# Patient Record
Sex: Female | Born: 1995
Health system: Southern US, Community
[De-identification: ages and names within clinical notes are randomized; demographics above are authoritative.]

## PROBLEM LIST (undated history)

## (undated) DIAGNOSIS — J302 Other seasonal allergic rhinitis: Secondary | ICD-10-CM

## (undated) HISTORY — DX: Other seasonal allergic rhinitis: J30.2

---
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
## 2015-03-25 ENCOUNTER — Telehealth: Payer: Self-pay | Admitting: Women's Health

## 2015-03-25 ENCOUNTER — Ambulatory Visit (INDEPENDENT_AMBULATORY_CARE_PROVIDER_SITE_OTHER): Payer: 59 | Admitting: Women's Health

## 2015-03-25 ENCOUNTER — Encounter: Payer: Self-pay | Admitting: Women's Health

## 2015-03-25 VITALS — BP 100/80 | Ht 61.0 in | Wt 95.0 lb

## 2015-03-25 DIAGNOSIS — B3731 Acute candidiasis of vulva and vagina: Secondary | ICD-10-CM

## 2015-03-25 DIAGNOSIS — B373 Candidiasis of vulva and vagina: Secondary | ICD-10-CM | POA: Diagnosis not present

## 2015-03-25 DIAGNOSIS — Z01419 Encounter for gynecological examination (general) (routine) without abnormal findings: Secondary | ICD-10-CM

## 2015-03-25 DIAGNOSIS — Z113 Encounter for screening for infections with a predominantly sexual mode of transmission: Secondary | ICD-10-CM | POA: Diagnosis not present

## 2015-03-25 DIAGNOSIS — F4323 Adjustment disorder with mixed anxiety and depressed mood: Secondary | ICD-10-CM

## 2015-03-25 LAB — CBC WITH DIFFERENTIAL/PLATELET
BASOS ABS: 0 10*3/uL (ref 0.0–0.1)
Basophils Relative: 0 % (ref 0–1)
EOS PCT: 2 % (ref 0–5)
Eosinophils Absolute: 0.2 10*3/uL (ref 0.0–0.7)
HCT: 38.6 % (ref 36.0–46.0)
HEMOGLOBIN: 13.4 g/dL (ref 12.0–15.0)
Lymphocytes Relative: 34 % (ref 12–46)
Lymphs Abs: 2.9 10*3/uL (ref 0.7–4.0)
MCH: 29.8 pg (ref 26.0–34.0)
MCHC: 34.7 g/dL (ref 30.0–36.0)
MCV: 85.8 fL (ref 78.0–100.0)
MONO ABS: 0.6 10*3/uL (ref 0.1–1.0)
MPV: 9.4 fL (ref 8.6–12.4)
Monocytes Relative: 7 % (ref 3–12)
Neutro Abs: 4.8 10*3/uL (ref 1.7–7.7)
Neutrophils Relative %: 57 % (ref 43–77)
Platelets: 285 10*3/uL (ref 150–400)
RBC: 4.5 MIL/uL (ref 3.87–5.11)
RDW: 13.3 % (ref 11.5–15.5)
WBC: 8.5 10*3/uL (ref 4.0–10.5)

## 2015-03-25 LAB — WET PREP FOR TRICH, YEAST, CLUE
Clue Cells Wet Prep HPF POC: NONE SEEN
Trich, Wet Prep: NONE SEEN

## 2015-03-25 LAB — TSH: TSH: 0.686 u[IU]/mL (ref 0.350–4.500)

## 2015-03-25 MED ORDER — FLUCONAZOLE 150 MG PO TABS: 150.0000 mg | ORAL_TABLET | Freq: Once | ORAL | Status: DC

## 2015-03-25 NOTE — Telephone Encounter (Signed)
03/25/15-Pt was informed today that her [facility] [employer] ins covers the removal and insertion of Nexplanon for contraception at 100%. She will find out When last one was inserted and call back to schedule replacement.wl

## 2015-03-25 NOTE — Progress Notes (Signed)
Mallory Myers 1996-09-25 [id number]    History:    Presents for annual exam.  Irregular bleeding, every 2-3 months spotting on Nexplanon [facility]. History of long-term depression on no medication. Gardasil series completed.  Past medical history, past surgical history, family history and social history were all reviewed and documented in the EPIC chart. 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 Same partner for 1-1/2 years. Mother anxiety/hypertension/asthma.  ROS:  A ROS was performed and pertinent positives and negatives are included.  Exam:  Filed Vitals:   03/25/15 1454  BP: 100/80    General appearance:  Normal Thyroid:  Symmetrical, normal in size, without palpable masses or nodularity. Respiratory  Auscultation:  Clear without wheezing or rhonchi Cardiovascular  Auscultation:  Regular rate, without rubs, murmurs or gallops  Edema/varicosities:  Not grossly evident Abdominal  Soft,nontender, without masses, guarding or rebound.  Liver/spleen:  No organomegaly noted  Hernia:  None appreciated  Skin  Inspection:  Grossly normal   Breasts: Examined lying and sitting.     Right: Without masses, retractions, discharge or axillary adenopathy.     Left: Without masses, retractions, discharge or axillary adenopathy. Gentitourinary   Inguinal/mons:  Normal without inguinal adenopathy  External genitalia:  Normal  BUS/Urethra/Skene's glands:  Normal  Vagina:  Moderate white discharge, wet prep positive for yeast  Cervix:  Normal  Uterus:   normal in size, shape and contour.  Midline and mobile  Adnexa/parametria:     Rt: Without masses or tenderness.   Lt: Without masses or tenderness.  Anus and perineum: Normal   Assessment/Plan:  19 y.o. SWF G0 for annual exam.   Implanon left arm 2014 STD screen Yeast vaginitis Anxiety/depression-therapy in the past on no medication.  Plan:1. [facility] find out date of placement of Implanon, reviewed it is good for 3 years, if due to be changed instructed to call back. 2. GC/Chlamydia, HIV, hep B, C, RPR.  encouraged condoms until permanent partner. 3. Diflucan 150 by mouth 1 dose, yeast prevention discussed. 4. Encourage counseling as needed, long-term history of anxiety and depression. Health maintenance-SBE's, regular exercise, calcium rich diet, campus safety reviewed. MVI daily encouraged. CBC, TSH, UA.    Harrington ChallengerYOUNG,NANCY J WHNP, 3:50 PM 03/25/2015

## 2015-03-25 NOTE — Patient Instructions (Addendum)
Health Maintenance - 17-19 Years Old SCHOOL PERFORMANCE After high school, you may attend college or technical or vocational school, enroll in the [employer], or enter the workforce. PHYSICAL, SOCIAL, AND EMOTIONAL DEVELOPMENT  One hour of regular physical activity daily is recommended. Continue to participate in sports.  Develop your own interests and consider community service or volunteerism.  Make decisions about college and work plans.  Throughout these years, you should assume responsibility for your own health care. Increasing independence is important for you.  You may be exploring your sexual identity. Understand that you should never be in a situation that makes you feel uncomfortable, and tell your partner if you do not want to engage in sexual activity.  Body image may become important to you. Be mindful that eating disorders can develop at this time. Talk to your parents or other caregivers if you have concerns about body image, weight gain, or losing weight.  You may notice mood disturbances, depression, anxiety, attention problems, or trouble with alcohol. Talk to your health care provider if you have concerns about mental illness.  Set limits for yourself and talk with your parents or other caregivers about independent decision making.  Handle conflict without physical violence.  Avoid loud noises which may impair hearing.  Limit television and computer time to 2 hours each day. Individuals who engage in excessive inactivity are more likely to become overweight. RECOMMENDED IMMUNIZATIONS  Influenza vaccine.  All adults should be immunized every year.  All adults, including pregnant women and people with hives-only allergy to eggs, can receive the inactivated influenza (IIV) vaccine.  Adults aged 18-49 years can receive the recombinant influenza (RIV) vaccine. The RIV vaccine does not contain any egg protein.  Tetanus, diphtheria, and acellular pertussis (Td, Tdap)  vaccine.  Pregnant women should receive 1 dose of Tdap vaccine during each pregnancy. The dose should be obtained regardless of the length of time since the last dose. Immunization is preferred during the 27th to 36th week of gestation.  An adult who has not previously received Tdap or who does not know his or her vaccine status should receive 1 dose of Tdap. This initial dose should be followed by tetanus and diphtheria toxoids (Td) booster doses every 10 years.  Adults with an unknown or incomplete history of completing a 3-dose immunization series with Td-containing vaccines should begin or complete a primary immunization series including a Tdap dose.  Adults should receive a Td booster every 10 years.  Varicella vaccine.  An adult without evidence of immunity to varicella should receive 2 doses or a second dose if he or she has previously received 1 dose.  Pregnant females who do not have evidence of immunity should receive the first dose after pregnancy. This first dose should be obtained before leaving the health care facility. The second dose should be obtained 4-8 weeks after the first dose.  Human papillomavirus (HPV) vaccine.  Females aged 13-26 years who have not received the vaccine previously should obtain the 3-dose series.  The vaccine is not recommended for pregnant females. However, pregnancy testing is not needed before receiving a dose. If a female is found to be pregnant after receiving a dose, no treatment is needed. In that case, the remaining doses should be delayed until after the pregnancy.  Males aged 67-21 years who have not received the vaccine previously should receive the 3-dose series. Males aged 22-26 years may be immunized.  Immunization is recommended through the age of 40 years for any  female who has sex with males and did not get any or all doses earlier.  Immunization is recommended for any person with an immunocompromised condition through the age of 69  years if he or she did not get any or all doses earlier.  During the 3-dose series, the second dose should be obtained 4-8 weeks after the first dose. The third dose should be obtained 24 weeks after the first dose and 16 weeks after the second dose.  Measles, mumps, and rubella (MMR) vaccine.  Adults born in [birth year] or later should have 1 or more doses of MMR vaccine unless there is a contraindication to the vaccine or there is laboratory evidence of immunity to each of the three diseases.  A routine second dose of MMR vaccine should be obtained at least 28 days after the first dose for students attending postsecondary schools, health care workers, and international travelers.  For females of childbearing age, rubella immunity should be determined. If there is no evidence of immunity, females who are not pregnant should be vaccinated. If there is no evidence of immunity, females who are pregnant should delay immunization until after pregnancy.  Pneumococcal 13-valent conjugate (PCV13) vaccine.  When indicated, a person who is uncertain of his or her immunization history and has no record of immunization should receive the PCV13 vaccine.  An adult aged 31 years or older who has certain medical conditions and has not been previously immunized should receive 1 dose of PCV13 vaccine. This PCV13 should be followed with a dose of pneumococcal polysaccharide (PPSV23) vaccine. The PPSV23 vaccine dose should be obtained at least 8 weeks after the dose of PCV13 vaccine.  An adult aged 88 years or older who has certain medical conditions and previously received 1 or more doses of PPSV23 vaccine should receive 1 dose of PCV13. The PCV13 vaccine dose should be obtained 1 or more years after the last PPSV23 vaccine dose.  Pneumococcal polysaccharide (PPSV23) vaccine.  When PCV13 is also indicated, PCV13 should be obtained first.  An adult younger than age 19 years who has certain medical conditions should be  immunized.  Any person who resides in a long-term care facility should be immunized.  An adult smoker should be immunized.  People with an immunocompromised condition and certain other conditions should receive both PCV13 and PPSV23 vaccines.  People with human immunodeficiency virus (HIV) infection should be immunized as soon as possible after diagnosis.  Immunization during chemotherapy or radiation therapy should be avoided.  Routine use of PPSV23 vaccine is not recommended for American Indians, [place] Natives, or people younger than 65 years unless there are medical conditions that require PPSV23 vaccine.  When indicated, people who have unknown immunization and have no record of immunization should receive PPSV23 vaccine.  One-time revaccination 5 years after the first dose of PPSV23 is recommended for people aged 19-64 years who have chronic kidney failure, nephrotic syndrome, asplenia, or immunocompromised conditions.  Meningococcal vaccine.  Adults with asplenia or persistent complement component deficiencies should receive 2 doses of quadrivalent meningococcal conjugate (MenACWY-D) vaccine. The doses should be obtained at least 2 months apart.  Microbiologists working with certain meningococcal bacteria, [place] recruits, people at risk during an outbreak, and people who travel to or live in countries with a high rate of meningitis should be immunized.  A first-year college student up through age 60 years who is living in a residence hall should receive a dose if he or she did not receive a dose on  or after his or her 16th birthday.  Adults who have certain high-risk conditions should receive one or more doses of vaccine.  Hepatitis A vaccine.  Adults who wish to be protected from this disease, have certain high-risk conditions, work with hepatitis A-infected animals, work in hepatitis A research labs, or travel to or work in countries with a high rate of hepatitis A should be  immunized.  Adults who were previously unvaccinated and who anticipate close contact with an international adoptee during the first 60 days after arrival in the [place] from a country with a high rate of hepatitis A should be immunized.  Hepatitis B vaccine.  Adults who wish to be protected from this disease, have certain high-risk conditions, may be exposed to blood or other infectious body fluids, are household contacts or sex partners of hepatitis B positive people, are clients or workers in certain care facilities, or travel to or work in countries with a high rate of hepatitis B should be immunized.  Haemophilus influenzae type b (Hib) vaccine.  A previously unvaccinated person with asplenia or sickle cell disease or having a scheduled splenectomy should receive 1 dose of Hib vaccine.  Regardless of previous immunization, a recipient of a hematopoietic stem cell transplant should receive a 3-dose series 6-12 months after his or her successful transplant.  Hib vaccine is not recommended for adults with HIV infection. TESTING  Annual screening for vision and hearing problems is recommended. Vision should be screened at least once between 18-21 years of age.  You may be screened for anemia or tuberculosis.  You should have a blood test to check for high cholesterol.  You should be screened for alcohol and drug use.  If you are sexually active, you may be screened for sexually transmitted infections (STIs), pregnancy, or HIV. You should be screened for STIs if:  Your sexual activity has changed since the last screening test, and you are at an increased risk for chlamydia or gonorrhea. Ask your health care provider if you are at risk.  If you are at an increased risk for hepatitis B, you should be screened for this virus. You are considered at high risk for hepatitis B if you:  Were born in a country where hepatitis B occurs often. Talk with your health care provider about which  countries are considered high risk.  Have parents who were born in a high-risk country and have not received a shot to protect against hepatitis B (hepatitis B vaccine).  Have HIV or AIDS.  Use needles to inject street drugs.  Live with or have sex with someone who has hepatitis B.  Are a man who has sex with other men (MSM).  Get hemodialysis treatment.  Take certain medicines for conditions like cancer, organ transplantation, or autoimmune conditions. NUTRITION   You should:  Have three servings of low-fat milk and dairy products daily. If you do not drink milk or consume dairy products, you should eat calcium-enriched foods, such as juice, bread, or cereal. Dark, leafy greens or canned fish are alternate sources of calcium.  Drink plenty of water. Fruit juice should be limited to 8-12 oz (240-360 mL) each day. Sugary beverages and sodas should be avoided.  Avoid eating foods high in fat, salt, or sugar, such as chips, candy, and cookies.  Avoid fast foods and limit eating out at restaurants.  Try not to skip meals, especially breakfast. You should eat a variety of vegetables, fruits, and lean meats.  Eat meals   together as a family whenever possible. ORAL HEALTH Brush your teeth twice a day and floss at least once a day. You should have two dental exams a year.  SKIN CARE You should wear sunscreen when out in the sun. TALK TO SOMEONE ABOUT:  Precautions against pregnancy, contraception, and sexually transmitted infections.  Taking a prescription medicine daily to prevent HIV infection if you are at risk of being infected with HIV. This is called preexposure prophylaxis (PrEP). You are at risk if you:  Are a female who has sex with other males (MSM).  Are heterosexual and sexually active with more than one partner.  Take drugs by injection.  Are sexually active with a partner who has HIV.  Whether you are at high risk of being infected with HIV. If you choose to begin  PrEP, you should first be tested for HIV. You should then be tested every 3 months for as long as you are taking PrEP.  Drug, tobacco, and alcohol use among your friends or at friends' homes. Smoking tobacco or marijuana and taking drugs have health consequences and may impact your brain development.  Appropriate use of over-the-counter or prescription medicines.  Driving guidelines and riding with friends.  The risks of drinking and driving or boating. Call someone if you have been drinking or using drugs and need a ride. WHAT'S NEXT? Visit your pediatrician or family physician once a year. By young adulthood, you should transition from your pediatrician to a family physician or internal medicine specialist. If you are a female and are sexually active, you may want to begin annual physical exams with a gynecologist. Document Released: 02/09/2007 Document Revised: 11/19/2013 Document Reviewed: 03/01/2007 [facility] Patient Information 2015 [place], [place]. This information is not intended to replace advice given to you by your health care provider. Make sure you discuss any questions you have with your health care provider. Monilial Vaginitis Vaginitis in a soreness, swelling and redness (inflammation) of the vagina and vulva. Monilial vaginitis is not a sexually transmitted infection. CAUSES  Yeast vaginitis is caused by yeast (candida) that is normally found in your vagina. With a yeast infection, the candida has overgrown in number to a point that upsets the chemical balance. SYMPTOMS   White, thick vaginal discharge.  Swelling, itching, redness and irritation of the vagina and possibly the lips of the vagina (vulva).  Burning or painful urination.  Painful intercourse. DIAGNOSIS  Things that may contribute to monilial vaginitis are:  Postmenopausal and virginal states.  Pregnancy.  Infections.  Being tired, sick or stressed, especially if you had monilial vaginitis in the  past.  Diabetes. Good control will help lower the chance.  Birth control pills.  Tight fitting garments.  Using bubble bath, feminine sprays, douches or deodorant tampons.  Taking certain medications that kill germs (antibiotics).  Sporadic recurrence can occur if you become ill. TREATMENT  Your caregiver will give you medication.  There are several kinds of anti monilial vaginal creams and suppositories specific for monilial vaginitis. For recurrent yeast infections, use a suppository or cream in the vagina 2 times a week, or as directed.  Anti-monilial or steroid cream for the itching or irritation of the vulva may also be used. Get your caregiver's permission.  Painting the vagina with methylene blue solution may help if the monilial cream does not work.  Eating yogurt may help prevent monilial vaginitis. HOME CARE INSTRUCTIONS   Finish all medication as prescribed.  Do not have sex until treatment is completed  or after your caregiver tells you it is okay.  Take warm sitz baths.  Do not douche.  Do not use tampons, especially scented ones.  Wear cotton underwear.  Avoid tight pants and panty hose.  Tell your sexual partner that you have a yeast infection. They should go to their caregiver if they have symptoms such as mild rash or itching.  Your sexual partner should be treated as well if your infection is difficult to eliminate.  Practice safer sex. Use condoms.  Some vaginal medications cause latex condoms to fail. Vaginal medications that harm condoms are:  Cleocin cream.  Butoconazole (Femstat).  Terconazole (Terazol) vaginal suppository.  Miconazole (Monistat) (may be purchased over the counter). SEEK MEDICAL CARE IF:   You have a temperature by mouth above 102 F (38.9 C).  The infection is getting worse after 2 days of treatment.  The infection is not getting better after 3 days of treatment.  You develop blisters in or around your  vagina.  You develop vaginal bleeding, and it is not your menstrual period.  You have pain when you urinate.  You develop intestinal problems.  You have pain with sexual intercourse. Document Released: 08/24/2005 Document Revised: 02/06/2012 Document Reviewed: 05/08/2009 [facility] Patient Information 2015 [place], [place]. This information is not intended to replace advice given to you by your health care provider. Make sure you discuss any questions you have with your health care provider.

## 2015-03-26 LAB — HIV ANTIBODY (ROUTINE TESTING W REFLEX): HIV: NONREACTIVE

## 2015-03-26 LAB — URINALYSIS W MICROSCOPIC + REFLEX CULTURE
BILIRUBIN URINE: NEGATIVE
Bacteria, UA: NONE SEEN
Casts: NONE SEEN
Crystals: NONE SEEN
GLUCOSE, UA: NEGATIVE mg/dL
HGB URINE DIPSTICK: NEGATIVE
Ketones, ur: NEGATIVE mg/dL
LEUKOCYTES UA: NEGATIVE
Nitrite: NEGATIVE
Protein, ur: NEGATIVE mg/dL
Specific Gravity, Urine: 1.03 (ref 1.005–1.030)
Urobilinogen, UA: 0.2 mg/dL (ref 0.0–1.0)
pH: 5.5 (ref 5.0–8.0)

## 2015-03-26 LAB — HEPATITIS B SURFACE ANTIGEN: Hepatitis B Surface Ag: NEGATIVE

## 2015-03-26 LAB — GC/CHLAMYDIA PROBE AMP
CT Probe RNA: NEGATIVE
GC PROBE AMP APTIMA: NEGATIVE

## 2015-03-26 LAB — RPR

## 2015-03-26 LAB — HEPATITIS C ANTIBODY: HCV AB: NEGATIVE

## 2015-03-27 ENCOUNTER — Telehealth: Payer: Self-pay | Admitting: Gynecology

## 2015-03-27 NOTE — Telephone Encounter (Signed)
03/27/15-Talked with pt today to verify when her Nexplanon was placed. It was done January 2014  So replacement not until December this year or January 2017. Did explain need to verify benefits again if done in January. Pt to advise her father that she was given the benefits for the Nexplanon as he had called here earlier in regards to this but her DPR said to deal with her only.wl

## 2015-09-30 ENCOUNTER — Ambulatory Visit (INDEPENDENT_AMBULATORY_CARE_PROVIDER_SITE_OTHER): Payer: 59 | Admitting: Family Medicine

## 2015-09-30 ENCOUNTER — Encounter: Payer: Self-pay | Admitting: Family Medicine

## 2015-09-30 VITALS — BP 115/74 | HR 83 | Temp 98.3°F | Ht 62.5 in | Wt 96.5 lb

## 2015-09-30 DIAGNOSIS — R062 Wheezing: Secondary | ICD-10-CM

## 2015-09-30 DIAGNOSIS — N898 Other specified noninflammatory disorders of vagina: Secondary | ICD-10-CM

## 2015-09-30 DIAGNOSIS — L298 Other pruritus: Secondary | ICD-10-CM | POA: Diagnosis not present

## 2015-09-30 MED ORDER — AZITHROMYCIN 250 MG PO TABS: ORAL_TABLET | ORAL | Status: AC

## 2015-09-30 MED ORDER — FLUCONAZOLE 150 MG PO TABS: ORAL_TABLET | ORAL | Status: AC

## 2015-09-30 MED ORDER — PREDNISONE 20 MG PO TABS: ORAL_TABLET | ORAL | Status: AC

## 2015-09-30 NOTE — Progress Notes (Signed)
CC: Mallory Myers is a 19 y.o. female is here for Establish Care; Cough; Vaginal Itching; and Headache   Subjective: HPI:  Very pleasant 19 year old here to establish care.  She complains of 3-4 days of productive cough and wheezing. She has some shortness of breath when trying to fall asleep at night. Symptoms are slightly improved with taking a steamy shower. Nothing else seems to make symptoms better or worse. They're worse at night and overall moderate in severity. She believes that symptoms have been worsening. She's felt subjective fever and chills and is beginning to get a headache in the sinuses of the forehead and cheeks. She denies sore throat, nasal congestion or pain with breathing.  She's also noticed some mild vaginal itching that has slowly been getting better over the past 5 days. She denies any vaginal discharge or any genitourinary complaints other than this  Review of Systems - General ROS: negative for - night sweats, weight gain or weight loss Ophthalmic ROS: negative for - decreased vision Psychological ROS: negative for - anxiety or depression ENT ROS: negative for - hearing change, nasal congestion, tinnitus or allergies Hematological and Lymphatic ROS: negative for - bleeding problems, bruising or swollen lymph nodes Breast ROS: negative Respiratory ROS: Positive for cough, shortness of breath, and wheezing Cardiovascular ROS: no chest pain or dyspnea on exertion Gastrointestinal ROS: no abdominal pain, change in bowel habits, or black or bloody stools Genito-Urinary ROS: negative for - genital discharge, genital ulcers, incontinence or abnormal bleeding from genitals Musculoskeletal ROS: negative for - joint pain or muscle pain Neurological ROS: negative for - headaches or memory loss Dermatological ROS: negative for lumps, mole changes, rash and skin lesion changes  Past Medical History  Diagnosis Date  . Seasonal allergies     Past Surgical History   Procedure Laterality Date  . Tympanostomy tube placement  1999   Family History  Problem Relation Age of Onset  . Depression Mother   . Anxiety disorder Mother   . Hypertension Mother   . Asthma Mother   . Alcohol abuse Other     Social History   Social History  . Marital Status: Single    Spouse Name: N/A  . Number of Children: N/A  . Years of Education: N/A   Occupational History  . Not on file.   Social History Main Topics  . Smoking status: Never Smoker   . Smokeless tobacco: Never Used  . Alcohol Use: No  . Drug Use: No  . Sexual Activity: Yes    Birth Control/ Protection: Implant     Comment: INTERCOURSE AGE 19, SEXUAL PARTNERS LESS THAN 5   Other Topics Concern  . Not on file   Social History Narrative     Objective: BP 115/74 mmHg  Pulse 83  Temp(Src) 98.3 F (36.8 C) (Oral)  Ht 5' 2.5" (1.588 m)  Wt 96 lb 8 oz (43.772 kg)  BMI 17.36 kg/m2  General: Alert and Oriented, No Acute Distress HEENT: Pupils equal, round, reactive to light. Conjunctivae clear.  External ears unremarkable, canals clear with intact TMs with appropriate landmarks.  Middle ear appears open without effusion. Pink inferior turbinates.  Moist mucous membranes, pharynx without inflammation nor lesions.  Neck supple without palpable lymphadenopathy nor abnormal masses. Lungs: Comfortable work of breathing with no rhonchi or rales but she does have mild wheezing in the left posterior upper lung fields Cardiac: Regular rate and rhythm. Normal S1/S2.  No murmurs, rubs, nor gallops.   Extremities:  No peripheral edema.  Strong peripheral pulses.  Mental Status: No depression, anxiety, nor agitation. Skin: Warm and dry.  Assessment & Plan: Wyn ForsterMadison was seen today for establish care, cough, vaginal itching and headache.  Diagnoses and all orders for this visit:  Wheezing -     azithromycin (ZITHROMAX) 250 MG tablet; Take two tabs at once on day 1, then one tab daily on days 2-5. -      predniSONE (DELTASONE) 20 MG tablet; Three tabs at once daily for five days.  Vaginal itching -     fluconazole (DIFLUCAN) 150 MG tablet; Take one tab, may take second tab if no improvement after 72 hours.  Wheezing: Start azithromycin and prednisone burst. Since she'll be on steroids and antibiotic the chances of vaginal candidiasis in the setting of already having some itching is high enough to where I would like her to have a consult on hand in case symptoms worsen with respect to itching.  Return if symptoms worsen or fail to improve.

## 2015-11-26 ENCOUNTER — Telehealth: Payer: Self-pay | Admitting: Gynecology

## 2015-11-26 NOTE — Telephone Encounter (Signed)
11/26/15-I attempted to call pt on cell as DPR directed but was told by recording line doesn't accept incoming calls. Her [employer] ins will cover the replacement of her Nexplanon for contraception at 100%, no copay. She already has appt with JF for January 2017.wl

## 2015-12-09 ENCOUNTER — Encounter: Payer: Self-pay | Admitting: Gynecology

## 2015-12-09 ENCOUNTER — Ambulatory Visit (INDEPENDENT_AMBULATORY_CARE_PROVIDER_SITE_OTHER): Payer: 59 | Admitting: Gynecology

## 2015-12-09 VITALS — BP 110/70

## 2015-12-09 DIAGNOSIS — Z975 Presence of (intrauterine) contraceptive device: Secondary | ICD-10-CM | POA: Insufficient documentation

## 2015-12-09 DIAGNOSIS — Z3046 Encounter for surveillance of implantable subdermal contraceptive: Secondary | ICD-10-CM

## 2015-12-09 DIAGNOSIS — Z30017 Encounter for initial prescription of implantable subdermal contraceptive: Secondary | ICD-10-CM

## 2015-12-09 NOTE — Patient Instructions (Signed)
Etonogestrel implant What is this medicine? ETONOGESTREL (et oh noe JES trel) is a contraceptive (birth control) device. It is used to prevent pregnancy. It can be used for up to 3 years. This medicine may be used for other purposes; ask your health care provider or pharmacist if you have questions. What should I tell my health care provider before I take this medicine? They need to know if you have any of these conditions: -abnormal vaginal bleeding -blood vessel disease or blood clots -cancer of the breast, cervix, or liver -depression -diabetes -gallbladder disease -headaches -heart disease or recent heart attack -high blood pressure -high cholesterol -kidney disease -liver disease -renal disease -seizures -tobacco smoker -an unusual or allergic reaction to etonogestrel, other hormones, anesthetics or antiseptics, medicines, foods, dyes, or preservatives -pregnant or trying to get pregnant -breast-feeding How should I use this medicine? This device is inserted just under the skin on the inner side of your upper arm by a health care professional. Talk to your pediatrician regarding the use of this medicine in children. Special care may be needed. Overdosage: If you think you have taken too much of this medicine contact a poison control center or emergency room at once. NOTE: This medicine is only for you. Do not share this medicine with others. What if I miss a dose? This does not apply. What may interact with this medicine? Do not take this medicine with any of the following medications: -amprenavir -bosentan -fosamprenavir This medicine may also interact with the following medications: -barbiturate medicines for inducing sleep or treating seizures -certain medicines for fungal infections like ketoconazole and itraconazole -griseofulvin -medicines to treat seizures like carbamazepine, felbamate, oxcarbazepine, phenytoin,  topiramate -modafinil -phenylbutazone -rifampin -some medicines to treat HIV infection like atazanavir, indinavir, lopinavir, nelfinavir, tipranavir, ritonavir -St. John's wort This list may not describe all possible interactions. Give your health care provider a list of all the medicines, herbs, non-prescription drugs, or dietary supplements you use. Also tell them if you smoke, drink alcohol, or use illegal drugs. Some items may interact with your medicine. What should I watch for while using this medicine? This product does not protect you against HIV infection (AIDS) or other sexually transmitted diseases. You should be able to feel the implant by pressing your fingertips over the skin where it was inserted. Contact your doctor if you cannot feel the implant, and use a non-hormonal birth control method (such as condoms) until your doctor confirms that the implant is in place. If you feel that the implant may have broken or become bent while in your arm, contact your healthcare provider. What side effects may I notice from receiving this medicine? Side effects that you should report to your doctor or health care professional as soon as possible: -allergic reactions like skin rash, itching or hives, swelling of the face, lips, or tongue -breast lumps -changes in emotions or moods -depressed mood -heavy or prolonged menstrual bleeding -pain, irritation, swelling, or bruising at the insertion site -scar at site of insertion -signs of infection at the insertion site such as fever, and skin redness, pain or discharge -signs of pregnancy -signs and symptoms of a blood clot such as breathing problems; changes in vision; chest pain; severe, sudden headache; pain, swelling, warmth in the leg; trouble speaking; sudden numbness or weakness of the face, arm or leg -signs and symptoms of liver injury like dark yellow or brown urine; general ill feeling or flu-like symptoms; light-colored stools; loss of  appetite; nausea; right upper belly   pain; unusually weak or tired; yellowing of the eyes or skin -unusual vaginal bleeding, discharge -signs and symptoms of a stroke like changes in vision; confusion; trouble speaking or understanding; severe headaches; sudden numbness or weakness of the face, arm or leg; trouble walking; dizziness; loss of balance or coordination Side effects that usually do not require medical attention (Report these to your doctor or health care professional if they continue or are bothersome.): -acne -back pain -breast pain -changes in weight -dizziness -general ill feeling or flu-like symptoms -headache -irregular menstrual bleeding -nausea -sore throat -vaginal irritation or inflammation This list may not describe all possible side effects. Call your doctor for medical advice about side effects. You may report side effects to FDA at 1-800-FDA-1088. Where should I keep my medicine? This drug is given in a hospital or clinic and will not be stored at home. NOTE: This sheet is a summary. It may not cover all possible information. If you have questions about this medicine, talk to your doctor, pharmacist, or health care provider.    2016, [employer]. (2014-08-29 14:07:06)  

## 2015-12-09 NOTE — Progress Notes (Signed)
   Patient is a 20 year old who presented to the office to remove her expired Nexplanon and to reinsert a new one. Patient has had good success with it as a form of contraception and cycle control. She was seen for her annual exam less than one year ago. See previous note.                                                             Nexplanon procedure note (removal)  The patient presented to the office today requesting for removal of her Nexplanon that was placed in the year 2013 on her left arm.   On examination the nexplanon implant was palpated and the distal end  (end  closest to the elbow) was marked. The area was sterilized with Betadine solution. 1% lidocaine was used for local anesthesia and approximately 1 cc  was injected into the site that was marked where  the incision was to be made. The local anesthetic was injected under the implant in an effort to keep it  close to the skin surface. Slight pressure pushing downward was made at the proximal end  of the implant in an effort to stabilize it. A bulge appeared indicating the distal end of the implant. Starting at the distal tip of the implant, a small longitudinal incision of 2 mm was made towards the elbow. By gently pushing the implant toward the incision the tip became visible. Grasping the implant with a curved forcep facilitated in gently removing the implant. Full confirmation of the entire implant which is 4 cm long was inspected and was intact and was shown to the patient and discarded. After removing the implant,                                              Nexplanon Procedure Note (insertion)      The Nexplanon applicator was lowered to a horizontal position. While lifting the skin with the tip of the needle the needle was then slid to its full length. The applicator was kept in sitting position with a needle inserted to its full length. The purple slider was unlocked by pushing it slightly downward. The slider was fully moved back  until it stopped. This allowed the implant to be in the final subdermal position and the needle was locked inside the body of the applicator. The applicator was then removed. 2 interrupted sutures of 2-0 Vicryl was placed to approximate the skin incision and a Kerlix wrap was placed which patient is to remove tomorrow. No complications patient tolerated procedure well and was released home with instructions. Patient will return back next week to remove the suture.   [facility] AtlantaFERNANDEZ,JUAN HMD4:47 PMTD@   Lot number [id number]

## 2015-12-10 ENCOUNTER — Encounter: Payer: Self-pay | Admitting: Gynecology

## 2015-12-17 ENCOUNTER — Ambulatory Visit (INDEPENDENT_AMBULATORY_CARE_PROVIDER_SITE_OTHER): Payer: 59 | Admitting: Gynecology

## 2015-12-17 ENCOUNTER — Encounter: Payer: Self-pay | Admitting: Gynecology

## 2015-12-17 VITALS — BP 112/76

## 2015-12-17 DIAGNOSIS — Z4802 Encounter for removal of sutures: Secondary | ICD-10-CM

## 2015-12-17 NOTE — Progress Notes (Signed)
   Patient is a 20 year old who presented to the office today to remove her sutures from her left arm medial side from area in which an expired Nexplanon had been removed and a new one inserted. 2 sutures of interrupted 2-0 Vicryl had been placed.  Incision site completely healed. Suture was removed after applying Betadine solution and a Steri-Strip and a Band-Aid.

## 2016-11-23 ENCOUNTER — Encounter: Payer: 59 | Admitting: Women's Health

## 2016-12-08 ENCOUNTER — Encounter: Payer: Self-pay | Admitting: Women's Health

## 2016-12-08 ENCOUNTER — Ambulatory Visit (INDEPENDENT_AMBULATORY_CARE_PROVIDER_SITE_OTHER): Payer: 59 | Admitting: Women's Health

## 2016-12-08 VITALS — BP 102/68 | Ht 61.25 in | Wt 100.0 lb

## 2016-12-08 DIAGNOSIS — Z01419 Encounter for gynecological examination (general) (routine) without abnormal findings: Secondary | ICD-10-CM | POA: Diagnosis not present

## 2016-12-08 DIAGNOSIS — B9689 Other specified bacterial agents as the cause of diseases classified elsewhere: Secondary | ICD-10-CM

## 2016-12-08 DIAGNOSIS — N760 Acute vaginitis: Secondary | ICD-10-CM

## 2016-12-08 DIAGNOSIS — L298 Other pruritus: Secondary | ICD-10-CM

## 2016-12-08 DIAGNOSIS — N898 Other specified noninflammatory disorders of vagina: Secondary | ICD-10-CM

## 2016-12-08 LAB — CBC WITH DIFFERENTIAL/PLATELET
Basophils Absolute: 0 cells/uL (ref 0–200)
Basophils Relative: 0 %
Eosinophils Absolute: 156 cells/uL (ref 15–500)
Eosinophils Relative: 2 %
HCT: 39.1 % (ref 35.0–45.0)
HEMOGLOBIN: 13.3 g/dL (ref 11.7–15.5)
LYMPHS ABS: 2730 {cells}/uL (ref 850–3900)
Lymphocytes Relative: 35 %
MCH: 30.2 pg (ref 27.0–33.0)
MCHC: 34 g/dL (ref 32.0–36.0)
MCV: 88.7 fL (ref 80.0–100.0)
MPV: 9.4 fL (ref 7.5–12.5)
Monocytes Absolute: 546 cells/uL (ref 200–950)
Monocytes Relative: 7 %
NEUTROS ABS: 4368 {cells}/uL (ref 1500–7800)
Neutrophils Relative %: 56 %
Platelets: 287 10*3/uL (ref 140–400)
RBC: 4.41 MIL/uL (ref 3.80–5.10)
RDW: 13.2 % (ref 11.0–15.0)
WBC: 7.8 10*3/uL (ref 3.8–10.8)

## 2016-12-08 LAB — URINALYSIS W MICROSCOPIC + REFLEX CULTURE
BILIRUBIN URINE: NEGATIVE
Bacteria, UA: NONE SEEN [HPF]
CRYSTALS: NONE SEEN [HPF]
Casts: NONE SEEN [LPF]
Glucose, UA: NEGATIVE
Hgb urine dipstick: NEGATIVE
Ketones, ur: NEGATIVE
Leukocytes, UA: NEGATIVE
NITRITE: NEGATIVE
PH: 6.5 (ref 5.0–8.0)
Protein, ur: NEGATIVE
RBC / HPF: NONE SEEN RBC/HPF (ref <=2)
SPECIFIC GRAVITY, URINE: 1.011 (ref 1.001–1.035)
WBC UA: NONE SEEN WBC/HPF (ref <=5)
Yeast: NONE SEEN [HPF]

## 2016-12-08 LAB — WET PREP FOR TRICH, YEAST, CLUE: Trich, Wet Prep: NONE SEEN

## 2016-12-08 MED ORDER — METRONIDAZOLE 0.75 % VA GEL: VAGINAL | 0 refills | Status: DC

## 2016-12-08 MED ORDER — FLUCONAZOLE 150 MG PO TABS: 150.0000 mg | ORAL_TABLET | Freq: Once | ORAL | 1 refills | Status: AC

## 2016-12-08 NOTE — Progress Notes (Signed)
Koren BoundMadison J Knoke [birth year] [id number]    History:    Presents for annual exam. Irregular spotting on Nexplanon placed 11/2015. History of depression on no medication. Same partner with negative STD screen. Gardasil series completed.  Past medical history, past surgical history, family history and social history were all reviewed and documented in the EPIC chart. 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 Mother anxiety, hypertension and asthma. Father works at [employer] in Audiological scientistaccounting.  ROS:  A ROS was performed and pertinent positives and negatives are included.  Exam:  Vitals:   12/08/16 1405  BP: 102/68  Weight: 100 lb (45.4 kg)  Height: 5' 1.25" (1.556 m)   Body mass index is 18.74 kg/m.   General appearance:  Normal Thyroid:  Symmetrical, normal in size, without palpable masses or nodularity. Respiratory  Auscultation:  Clear without wheezing or rhonchi Cardiovascular  Auscultation:  Regular rate, without rubs, murmurs or gallops  Edema/varicosities:  Not grossly evident Abdominal  Soft,nontender, without masses, guarding or rebound.  Liver/spleen:  No organomegaly noted  Hernia:  None appreciated  Skin  Inspection:  Grossly normal   Breasts: Examined lying and sitting.     Right: Without masses, retractions, discharge or axillary adenopathy.     Left: Without masses, retractions, discharge or axillary adenopathy. Gentitourinary   Inguinal/mons:  Normal without inguinal adenopathy  External genitalia:  Normal  BUS/Urethra/Skene's glands:  Normal  Vagina:  Erythema, wet prep positive for yeast, clues, TNTC  Cervix:  Normal  Uterus:   normal in size, shape and contour.  Midline and mobile  Adnexa/parametria:     Rt: Without masses or tenderness.   Lt: Without masses or tenderness.  Anus and perineum: Normal    Assessment/Plan:  21 y.o. SWF G0 for annual exam complaint of vaginal irritation with itching.    11/2015 Nexplanon with light irregular  bleeding History of Depression stable no medication Bacterial vaginosis and yeast vaginitis  Plan: MetroGel vaginal cream 1 applicator at bedtime 5, alcohol precautions reviewed, Diflucan 150 by mouth 1 dose prescription, proper use given and reviewed instructed to call if no relief of symptoms. Denies need for counseling at this time. SBE's, exercise, calcium rich diet, MVI daily encouraged. Campus safety reviewed. CBC, [facility].    YOUNG,NANCY J WHNP, 2:23 PM 12/08/2016

## 2016-12-08 NOTE — Patient Instructions (Signed)
Health Maintenance, Female Introduction Adopting a healthy lifestyle and getting preventive care can go a long way to promote health and wellness. Talk with your health care provider about what schedule of regular examinations is right for you. This is a good chance for you to check in with your provider about disease prevention and staying healthy. In between checkups, there are plenty of things you can do on your own. Experts have done a lot of research about which lifestyle changes and preventive measures are most likely to keep you healthy. Ask your health care provider for more information. Weight and diet Eat a healthy diet  Be sure to include plenty of vegetables, fruits, low-fat dairy products, and lean protein.  Do not eat a lot of foods high in solid fats, added sugars, or salt.  Get regular exercise. This is one of the most important things you can do for your health.  Most adults should exercise for at least 150 minutes each week. The exercise should increase your heart rate and make you sweat (moderate-intensity exercise).  Most adults should also do strengthening exercises at least twice a week. This is in addition to the moderate-intensity exercise. Maintain a healthy weight  Body mass index (BMI) is a measurement that can be used to identify possible weight problems. It estimates body fat based on height and weight. Your health care provider can help determine your BMI and help you achieve or maintain a healthy weight.  For females 63 years of age and older:  A BMI below 18.5 is considered underweight.  A BMI of 18.5 to 24.9 is normal.  A BMI of 25 to 29.9 is considered overweight.  A BMI of 30 and above is considered obese. Watch levels of cholesterol and blood lipids  You should start having your blood tested for lipids and cholesterol at 21 years of age, then have this test every 5 years.  You may need to have your cholesterol levels checked more often if:  Your  lipid or cholesterol levels are high.  You are older than 21 years of age.  You are at high risk for heart disease. Cancer screening Lung Cancer  Lung cancer screening is recommended for adults 56-22 years old who are at high risk for lung cancer because of a history of smoking.  A yearly low-dose CT scan of the lungs is recommended for people who:  Currently smoke.  Have quit within the past 15 years.  Have at least a 30-pack-year history of smoking. A pack year is smoking an average of one pack of cigarettes a day for 1 year.  Yearly screening should continue until it has been 15 years since you quit.  Yearly screening should stop if you develop a health problem that would prevent you from having lung cancer treatment. Breast Cancer  Practice breast self-awareness. This means understanding how your breasts normally appear and feel.  It also means doing regular breast self-exams. Let your health care provider know about any changes, no matter how small.  If you are in your 20s or 30s, you should have a clinical breast exam (CBE) by a health care provider every 1-3 years as part of a regular health exam.  If you are 35 or older, have a CBE every year. Also consider having a breast X-ray (mammogram) every year.  If you have a family history of breast cancer, talk to your health care provider about genetic screening.  If you are at high risk for breast cancer,  talk to your health care provider about having an MRI and a mammogram every year.  Breast cancer gene (BRCA) assessment is recommended for women who have family members with BRCA-related cancers. BRCA-related cancers include:  Breast.  Ovarian.  Tubal.  Peritoneal cancers.  Results of the assessment will determine the need for genetic counseling and BRCA1 and BRCA2 testing. Cervical Cancer  Your health care provider may recommend that you be screened regularly for cancer of the pelvic organs (ovaries, uterus, and  vagina). This screening involves a pelvic examination, including checking for microscopic changes to the surface of your cervix (Pap test). You may be encouraged to have this screening done every 3 years, beginning at age 21.  For women ages 30-65, health care providers may recommend pelvic exams and Pap testing every 3 years, or they may recommend the Pap and pelvic exam, combined with testing for human papilloma virus (HPV), every 5 years. Some types of HPV increase your risk of cervical cancer. Testing for HPV may also be done on women of any age with unclear Pap test results.  Other health care providers may not recommend any screening for nonpregnant women who are considered low risk for pelvic cancer and who do not have symptoms. Ask your health care provider if a screening pelvic exam is right for you.  If you have had past treatment for cervical cancer or a condition that could lead to cancer, you need Pap tests and screening for cancer for at least 20 years after your treatment. If Pap tests have been discontinued, your risk factors (such as having a new sexual partner) need to be reassessed to determine if screening should resume. Some women have medical problems that increase the chance of getting cervical cancer. In these cases, your health care provider may recommend more frequent screening and Pap tests. Colorectal Cancer  This type of cancer can be detected and often prevented.  Routine colorectal cancer screening usually begins at 21 years of age and continues through 21 years of age.  Your health care provider may recommend screening at an earlier age if you have risk factors for colon cancer.  Your health care provider may also recommend using home test kits to check for hidden blood in the stool.  A small camera at the end of a tube can be used to examine your colon directly (sigmoidoscopy or colonoscopy). This is done to check for the earliest forms of colorectal  cancer.  Routine screening usually begins at age 50.  Direct examination of the colon should be repeated every 5-10 years through 21 years of age. However, you may need to be screened more often if early forms of precancerous polyps or small growths are found. Skin Cancer  Check your skin from head to toe regularly.  Tell your health care provider about any new moles or changes in moles, especially if there is a change in a mole's shape or color.  Also tell your health care provider if you have a mole that is larger than the size of a pencil eraser.  Always use sunscreen. Apply sunscreen liberally and repeatedly throughout the day.  Protect yourself by wearing long sleeves, pants, a wide-brimmed hat, and sunglasses whenever you are outside. Heart disease, diabetes, and high blood pressure  High blood pressure causes heart disease and increases the risk of stroke. High blood pressure is more likely to develop in:  People who have blood pressure in the high end of the normal range (130-139/85-89 mm Hg).    People who are overweight or obese.  People who are African American.  If you are 18-39 years of age, have your blood pressure checked every 3-5 years. If you are 40 years of age or older, have your blood pressure checked every year. You should have your blood pressure measured twice-once when you are at a hospital or clinic, and once when you are not at a hospital or clinic. Record the average of the two measurements. To check your blood pressure when you are not at a hospital or clinic, you can use:  An automated blood pressure machine at a pharmacy.  A home blood pressure monitor.  If you are between 55 years and 79 years old, ask your health care provider if you should take aspirin to prevent strokes.  Have regular diabetes screenings. This involves taking a blood sample to check your fasting blood sugar level.  If you are at a normal weight and have a low risk for diabetes,  have this test once every three years after 21 years of age.  If you are overweight and have a high risk for diabetes, consider being tested at a younger age or more often. Preventing infection Hepatitis B  If you have a higher risk for hepatitis B, you should be screened for this virus. You are considered at high risk for hepatitis B if:  You were born in a country where hepatitis B is common. Ask your health care provider which countries are considered high risk.  Your parents were born in a high-risk country, and you have not been immunized against hepatitis B (hepatitis B vaccine).  You have HIV or AIDS.  You use needles to inject street drugs.  You live with someone who has hepatitis B.  You have had sex with someone who has hepatitis B.  You get hemodialysis treatment.  You take certain medicines for conditions, including cancer, organ transplantation, and autoimmune conditions. Hepatitis C  Blood testing is recommended for:  Everyone born from 1945 through 1965.  Anyone with known risk factors for hepatitis C. Sexually transmitted infections (STIs)  You should be screened for sexually transmitted infections (STIs) including gonorrhea and chlamydia if:  You are sexually active and are younger than 21 years of age.  You are older than 21 years of age and your health care provider tells you that you are at risk for this type of infection.  Your sexual activity has changed since you were last screened and you are at an increased risk for chlamydia or gonorrhea. Ask your health care provider if you are at risk.  If you do not have HIV, but are at risk, it may be recommended that you take a prescription medicine daily to prevent HIV infection. This is called pre-exposure prophylaxis (PrEP). You are considered at risk if:  You are sexually active and do not regularly use condoms or know the HIV status of your partner(s).  You take drugs by injection.  You are sexually  active with a partner who has HIV. Talk with your health care provider about whether you are at high risk of being infected with HIV. If you choose to begin PrEP, you should first be tested for HIV. You should then be tested every 3 months for as long as you are taking PrEP. Pregnancy  If you are premenopausal and you may become pregnant, ask your health care provider about preconception counseling.  If you may become pregnant, take 400 to 800 micrograms (mcg) of folic acid   every day.  If you want to prevent pregnancy, talk to your health care provider about birth control (contraception). Osteoporosis and menopause  Osteoporosis is a disease in which the bones lose minerals and strength with aging. This can result in serious bone fractures. Your risk for osteoporosis can be identified using a bone density scan.  If you are 18 years of age or older, or if you are at risk for osteoporosis and fractures, ask your health care provider if you should be screened.  Ask your health care provider whether you should take a calcium or vitamin D supplement to lower your risk for osteoporosis.  Menopause may have certain physical symptoms and risks.  Hormone replacement therapy may reduce some of these symptoms and risks. Talk to your health care provider about whether hormone replacement therapy is right for you. Follow these instructions at home:  Schedule regular health, dental, and eye exams.  Stay current with your immunizations.  Do not use any tobacco products including cigarettes, chewing tobacco, or electronic cigarettes.  If you are pregnant, do not drink alcohol.  If you are breastfeeding, limit how much and how often you drink alcohol.  Limit alcohol intake to no more than 1 drink per day for nonpregnant women. One drink equals 12 ounces of beer, 5 ounces of wine, or 1 ounces of hard liquor.  Do not use street drugs.  Do not share needles.  Ask your health care provider for  help if you need support or information about quitting drugs.  Tell your health care provider if you often feel depressed.  Tell your health care provider if you have ever been abused or do not feel safe at home. This information is not intended to replace advice given to you by your health care provider. Make sure you discuss any questions you have with your health care provider. Document Released: 05/30/2011 Document Revised: 04/21/2016 Document Reviewed: 08/18/2015  2017 Elsevier Vaginal Yeast infection, Adult Vaginal yeast infection is a condition that causes soreness, swelling, and redness (inflammation) of the vagina. It also causes vaginal discharge. This is a common condition. Some women get this infection frequently. What are the causes? This condition is caused by a change in the normal balance of the yeast (candida) and bacteria that live in the vagina. This change causes an overgrowth of yeast, which causes the inflammation. What increases the risk? This condition is more likely to develop in:  Women who take antibiotic medicines.  Women who have diabetes.  Women who take birth control pills.  Women who are pregnant.  Women who douche often.  Women who have a weak defense (immune) system.  Women who have been taking steroid medicines for a long time.  Women who frequently wear tight clothing. What are the signs or symptoms? Symptoms of this condition include:  White, thick vaginal discharge.  Swelling, itching, redness, and irritation of the vagina. The lips of the vagina (vulva) may be affected as well.  Pain or a burning feeling while urinating.  Pain during sex. How is this diagnosed? This condition is diagnosed with a medical history and physical exam. This will include a pelvic exam. Your health care provider will examine a sample of your vaginal discharge under a microscope. Your health care provider may send this sample for testing to confirm the  diagnosis. How is this treated? This condition is treated with medicine. Medicines may be over-the-counter or prescription. You may be told to use one or more of the following:  Medicine that is taken orally.  Medicine that is applied as a cream.  Medicine that is inserted directly into the vagina (suppository). Follow these instructions at home:  Take or apply over-the-counter and prescription medicines only as told by your health care provider.  Do not have sex until your health care provider has approved. Tell your sex partner that you have a yeast infection. That person should go to his or her health care provider if he or she develops symptoms.  Do not wear tight clothes, such as pantyhose or tight pants.  Avoid using tampons until your health care provider approves.  Eat more yogurt. This may help to keep your yeast infection from returning.  Try taking a sitz bath to help with discomfort. This is a warm water bath that is taken while you are sitting down. The water should only come up to your hips and should cover your buttocks. Do this 3-4 times per day or as told by your health care provider.  Do not douche.  Wear breathable, cotton underwear.  If you have diabetes, keep your blood sugar levels under control. Contact a health care provider if:  You have a fever.  Your symptoms go away and then return.  Your symptoms do not get better with treatment.  Your symptoms get worse.  You have new symptoms.  You develop blisters in or around your vagina.  You have blood coming from your vagina and it is not your menstrual period.  You develop pain in your abdomen. This information is not intended to replace advice given to you by your health care provider. Make sure you discuss any questions you have with your health care provider. Document Released: 08/24/2005 Document Revised: 04/27/2016 Document Reviewed: 05/18/2015 Elsevier Interactive Patient Education  2017  [employer].

## 2017-03-20 ENCOUNTER — Encounter: Payer: Self-pay | Admitting: Women's Health

## 2017-03-20 ENCOUNTER — Ambulatory Visit (INDEPENDENT_AMBULATORY_CARE_PROVIDER_SITE_OTHER): Payer: 59 | Admitting: Women's Health

## 2017-03-20 VITALS — BP 106/74

## 2017-03-20 DIAGNOSIS — N926 Irregular menstruation, unspecified: Secondary | ICD-10-CM | POA: Diagnosis not present

## 2017-03-20 DIAGNOSIS — N760 Acute vaginitis: Secondary | ICD-10-CM | POA: Diagnosis not present

## 2017-03-20 DIAGNOSIS — L298 Other pruritus: Secondary | ICD-10-CM | POA: Diagnosis not present

## 2017-03-20 DIAGNOSIS — Z113 Encounter for screening for infections with a predominantly sexual mode of transmission: Secondary | ICD-10-CM

## 2017-03-20 DIAGNOSIS — N898 Other specified noninflammatory disorders of vagina: Secondary | ICD-10-CM

## 2017-03-20 DIAGNOSIS — B9689 Other specified bacterial agents as the cause of diseases classified elsewhere: Secondary | ICD-10-CM | POA: Diagnosis not present

## 2017-03-20 LAB — WET PREP FOR TRICH, YEAST, CLUE
TRICH WET PREP: NONE SEEN
Yeast Wet Prep HPF POC: NONE SEEN

## 2017-03-20 LAB — PREGNANCY, URINE: Preg Test, Ur: NEGATIVE

## 2017-03-20 MED ORDER — MEGESTROL ACETATE 20 MG PO TABS: 20.0000 mg | ORAL_TABLET | Freq: Every day | ORAL | 1 refills | Status: DC

## 2017-03-20 MED ORDER — FLUCONAZOLE 150 MG PO TABS: 150.0000 mg | ORAL_TABLET | Freq: Once | ORAL | 1 refills | Status: AC

## 2017-03-20 MED ORDER — METRONIDAZOLE 0.75 % VA GEL: VAGINAL | 0 refills | Status: DC

## 2017-03-20 NOTE — Patient Instructions (Signed)
Bacterial Vaginosis Bacterial vaginosis is a vaginal infection that occurs when the normal balance of bacteria in the vagina is disrupted. It results from an overgrowth of certain bacteria. This is the most common vaginal infection among women ages 15-44. Because bacterial vaginosis increases your risk for STIs (sexually transmitted infections), getting treated can help reduce your risk for chlamydia, gonorrhea, herpes, and HIV (human immunodeficiency virus). Treatment is also important for preventing complications in pregnant women, because this condition can cause an early (premature) delivery. What are the causes? This condition is caused by an increase in harmful bacteria that are normally present in small amounts in the vagina. However, the reason that the condition develops is not fully understood. What increases the risk? The following factors may make you more likely to develop this condition:  Having a new sexual partner or multiple sexual partners.  Having unprotected sex.  Douching.  Having an intrauterine device (IUD).  Smoking.  Drug and alcohol abuse.  Taking certain antibiotic medicines.  Being pregnant.  You cannot get bacterial vaginosis from toilet seats, bedding, swimming pools, or contact with objects around you. What are the signs or symptoms? Symptoms of this condition include:  Grey or white vaginal discharge. The discharge can also be watery or foamy.  A fish-like odor with discharge, especially after sexual intercourse or during menstruation.  Itching in and around the vagina.  Burning or pain with urination.  Some women with bacterial vaginosis have no signs or symptoms. How is this diagnosed? This condition is diagnosed based on:  Your medical history.  A physical exam of the vagina.  Testing a sample of vaginal fluid under a microscope to look for a large amount of bad bacteria or abnormal cells. Your health care provider may use a cotton swab  or a small wooden spatula to collect the sample.  How is this treated? This condition is treated with antibiotics. These may be given as a pill, a vaginal cream, or a medicine that is put into the vagina (suppository). If the condition comes back after treatment, a second round of antibiotics may be needed. Follow these instructions at home: Medicines  Take over-the-counter and prescription medicines only as told by your health care provider.  Take or use your antibiotic as told by your health care provider. Do not stop taking or using the antibiotic even if you start to feel better. General instructions  If you have a female sexual partner, tell her that you have a vaginal infection. She should see her health care provider and be treated if she has symptoms. If you have a female sexual partner, he does not need treatment.  During treatment: ? Avoid sexual activity until you finish treatment. ? Do not douche. ? Avoid alcohol as directed by your health care provider. ? Avoid breastfeeding as directed by your health care provider.  Drink enough water and fluids to keep your urine clear or pale yellow.  Keep the area around your vagina and rectum clean. ? Wash the area daily with warm water. ? Wipe yourself from front to back after using the toilet.  Keep all follow-up visits as told by your health care provider. This is important. How is this prevented?  Do not douche.  Wash the outside of your vagina with warm water only.  Use protection when having sex. This includes latex condoms and dental dams.  Limit how many sexual partners you have. To help prevent bacterial vaginosis, it is best to have sex with just   one partner (monogamous).  Make sure you and your sexual partner are tested for STIs.  Wear cotton or cotton-lined underwear.  Avoid wearing tight pants and pantyhose, especially during summer.  Limit the amount of alcohol that you drink.  Do not use any products that  contain nicotine or tobacco, such as cigarettes and e-cigarettes. If you need help quitting, ask your health care provider.  Do not use illegal drugs. Where to find more information:  Centers for Disease Control and Prevention: www.cdc.gov/std  American Sexual Health Association (ASHA): www.ashastd.org  [place] Department of Health and Human Services, Office on Women's Health: www.womenshealth.gov/ or https://www.womenshealth.gov/a-z-topics/bacterial-vaginosis Contact a health care provider if:  Your symptoms do not improve, even after treatment.  You have more discharge or pain when urinating.  You have a fever.  You have pain in your abdomen.  You have pain during sex.  You have vaginal bleeding between periods. Summary  Bacterial vaginosis is a vaginal infection that occurs when the normal balance of bacteria in the vagina is disrupted.  Because bacterial vaginosis increases your risk for STIs (sexually transmitted infections), getting treated can help reduce your risk for chlamydia, gonorrhea, herpes, and HIV (human immunodeficiency virus). Treatment is also important for preventing complications in pregnant women, because the condition can cause an early (premature) delivery.  This condition is treated with antibiotic medicines. These may be given as a pill, a vaginal cream, or a medicine that is put into the vagina (suppository). This information is not intended to replace advice given to you by your health care provider. Make sure you discuss any questions you have with your health care provider. Document Released: 11/14/2005 Document Revised: 07/30/2016 Document Reviewed: 07/30/2016 Elsevier Interactive Patient Education  2017 [employer] Inc[employer]  

## 2017-03-20 NOTE — Progress Notes (Signed)
Mallory Myers [birth year] [id number]  Presents with complaint of perineal/ rectal itching for the past 2 months, has had irregular bleeding for the past 2-3 weeks. New partner in the last month. Nexplanon 11/2015 has had some irregular spotting since placed but has increased. Denies urinary symptoms, abdominal pain or cramping, and no vaginal discharge.  Exam: Appears well. External genitalia within normal limits, no visible erythema, speculum exam scant brown discharge wet prep positive for moderate clues, TNTC bacteria. GC/Chlamydia culture taken. Bimanual no CMT or adnexal tenderness. No visible erythema or hemorrhoids noted at rectum. UPT negative  Bacterial vaginosis  Irregular bleeding STD screen  Plan: MetroGel vaginal cream 1 applicator at bedtime 5, alcohol precautions reviewed. Diflucan 150 by mouth 1 dose. Instructed to call if persistent itching  at rectum HIV, hep B, C, RPR, GC/Chlamydia culture pending. Options reviewed will try Megace 20 mg twice a day for 10 days. Prescription, proper use given and reviewed. Instructed to call if spotting persists. Condoms encouraged until permanent partner.     Harrington Challenger [facility], 12:40 PM 03/20/2017

## 2017-03-21 LAB — RPR

## 2017-03-21 LAB — HEPATITIS B SURFACE ANTIGEN: HEP B S AG: NEGATIVE

## 2017-03-21 LAB — HEPATITIS C ANTIBODY: HCV AB: NEGATIVE

## 2017-03-21 LAB — GC/CHLAMYDIA PROBE AMP
CT Probe RNA: NOT DETECTED
GC Probe RNA: NOT DETECTED

## 2017-03-21 LAB — HIV ANTIBODY (ROUTINE TESTING W REFLEX): HIV 1&2 Ab, 4th Generation: NONREACTIVE

## 2017-04-12 ENCOUNTER — Encounter: Payer: Self-pay | Admitting: Gynecology

## 2018-03-20 DIAGNOSIS — R112 Nausea with vomiting, unspecified: Secondary | ICD-10-CM | POA: Diagnosis not present

## 2018-03-20 DIAGNOSIS — J302 Other seasonal allergic rhinitis: Secondary | ICD-10-CM | POA: Diagnosis not present

## 2018-08-01 ENCOUNTER — Encounter: Payer: 59 | Admitting: Women's Health

## 2018-09-24 ENCOUNTER — Encounter: Payer: Self-pay | Admitting: Women's Health

## 2018-09-24 ENCOUNTER — Ambulatory Visit (INDEPENDENT_AMBULATORY_CARE_PROVIDER_SITE_OTHER): Payer: 59 | Admitting: Women's Health

## 2018-09-24 VITALS — BP 118/78 | Ht 61.0 in | Wt 102.0 lb

## 2018-09-24 DIAGNOSIS — Z113 Encounter for screening for infections with a predominantly sexual mode of transmission: Secondary | ICD-10-CM

## 2018-09-24 DIAGNOSIS — Z01419 Encounter for gynecological examination (general) (routine) without abnormal findings: Secondary | ICD-10-CM

## 2018-09-24 LAB — WET PREP FOR TRICH, YEAST, CLUE

## 2018-09-24 NOTE — Addendum Note (Signed)
Addended by: Tito Dine on: 09/24/2018 04:05 PM   Modules accepted: Orders

## 2018-09-24 NOTE — Progress Notes (Signed)
Mallory Myers 08/05/1996 [id number]    History:    Presents for annual exam. 11/2015 Nexplanon,  amenorrheic initially and in the past year has had some irregular cycles.  Last cycle was somewhat heavy last month.  Has not had a Pap.  Gardasil series completed.  New partner.  Always slim, reports good appetite.  Past medical history, past surgical history, family history and social history were all reviewed and documented in the EPIC chart.  Working as a Production assistant, radio.  Currently living in an RV on her parents property with her boyfriend.  Yoga instructor.  ROS:  A ROS was performed and pertinent positives and negatives are included.  Exam:  Vitals:   09/24/18 1443  BP: 118/78  Weight: 102 lb (46.3 kg)  Height: 5\' 1"  (1.549 m)   Body mass index is 19.27 kg/m.   General appearance:  Normal Thyroid:  Symmetrical, normal in size, without palpable masses or nodularity. Respiratory  Auscultation:  Clear without wheezing or rhonchi Cardiovascular  Auscultation:  Regular rate, without rubs, murmurs or gallops  Edema/varicosities:  Not grossly evident Abdominal  Soft,nontender, without masses, guarding or rebound.  Liver/spleen:  No organomegaly noted  Hernia:  None appreciated  Skin  Inspection:  Grossly normal   Breasts: Examined lying and sitting.     Right: Without masses, retractions, discharge or axillary adenopathy.     Left: Without masses, retractions, discharge or axillary adenopathy. Gentitourinary   Inguinal/mons:  Normal without inguinal adenopathy  External genitalia:  Normal  BUS/Urethra/Skene's glands:  Normal  Vagina:  Normal wet prep negative  Cervix:  Normal  Uterus:  normal in size, shape and contour.  Midline and mobile  Adnexa/parametria:     Rt: Without masses or tenderness.   Lt: Without masses or tenderness.  Anus and perineum: Normal    Assessment/Plan:  22 y.o. S WF G0 for annual exam with no complaints.  11/2015 Nexplanon irregular bleeding STD  screen  Plan: Contraception options reviewed would like to repeat Nexplanon will schedule appointment with Dr. Seymour Bars in January 2020.  SBE's, exercise, calcium rich foods, MVI daily encouraged.  Continue active lifestyle, certified yoga instructor.  CBC, GC/chlamydia, HIV, hep B, C, RPR.  Pap, screening guidance reviewed.   Harrington Challenger [facility], 3:28 PM 09/24/2018

## 2018-09-24 NOTE — Patient Instructions (Signed)
Health Maintenance, Female Adopting a healthy lifestyle and getting preventive care can go a long way to promote health and wellness. Talk with your health care provider about what schedule of regular examinations is right for you. This is a good chance for you to check in with your provider about disease prevention and staying healthy. In between checkups, there are plenty of things you can do on your own. Experts have done a lot of research about which lifestyle changes and preventive measures are most likely to keep you healthy. Ask your health care provider for more information. Weight and diet Eat a healthy diet  Be sure to include plenty of vegetables, fruits, low-fat dairy products, and lean protein.  Do not eat a lot of foods high in solid fats, added sugars, or salt.  Get regular exercise. This is one of the most important things you can do for your health. ? Most adults should exercise for at least 150 minutes each week. The exercise should increase your heart rate and make you sweat (moderate-intensity exercise). ? Most adults should also do strengthening exercises at least twice a week. This is in addition to the moderate-intensity exercise.  Maintain a healthy weight  Body mass index (BMI) is a measurement that can be used to identify possible weight problems. It estimates body fat based on height and weight. Your health care provider can help determine your BMI and help you achieve or maintain a healthy weight.  For females 20 years of age and older: ? A BMI below 18.5 is considered underweight. ? A BMI of 18.5 to 24.9 is normal. ? A BMI of 25 to 29.9 is considered overweight. ? A BMI of 30 and above is considered obese.  Watch levels of cholesterol and blood lipids  You should start having your blood tested for lipids and cholesterol at 22 years of age, then have this test every 5 years.  You may need to have your cholesterol levels checked more often if: ? Your lipid or  cholesterol levels are high. ? You are older than 22 years of age. ? You are at high risk for heart disease.  Cancer screening Lung Cancer  Lung cancer screening is recommended for adults 55-80 years old who are at high risk for lung cancer because of a history of smoking.  A yearly low-dose CT scan of the lungs is recommended for people who: ? Currently smoke. ? Have quit within the past 15 years. ? Have at least a 30-pack-year history of smoking. A pack year is smoking an average of one pack of cigarettes a day for 1 year.  Yearly screening should continue until it has been 15 years since you quit.  Yearly screening should stop if you develop a health problem that would prevent you from having lung cancer treatment.  Breast Cancer  Practice breast self-awareness. This means understanding how your breasts normally appear and feel.  It also means doing regular breast self-exams. Let your health care provider know about any changes, no matter how small.  If you are in your 20s or 30s, you should have a clinical breast exam (CBE) by a health care provider every 1-3 years as part of a regular health exam.  If you are 40 or older, have a CBE every year. Also consider having a breast X-ray (mammogram) every year.  If you have a family history of breast cancer, talk to your health care provider about genetic screening.  If you are at high risk   for breast cancer, talk to your health care provider about having an MRI and a mammogram every year.  Breast cancer gene (BRCA) assessment is recommended for women who have family members with BRCA-related cancers. BRCA-related cancers include: ? Breast. ? Ovarian. ? Tubal. ? Peritoneal cancers.  Results of the assessment will determine the need for genetic counseling and BRCA1 and BRCA2 testing.  Cervical Cancer Your health care provider may recommend that you be screened regularly for cancer of the pelvic organs (ovaries, uterus, and  vagina). This screening involves a pelvic examination, including checking for microscopic changes to the surface of your cervix (Pap test). You may be encouraged to have this screening done every 3 years, beginning at age 22.  For women ages 56-65, health care providers may recommend pelvic exams and Pap testing every 3 years, or they may recommend the Pap and pelvic exam, combined with testing for human papilloma virus (HPV), every 5 years. Some types of HPV increase your risk of cervical cancer. Testing for HPV may also be done on women of any age with unclear Pap test results.  Other health care providers may not recommend any screening for nonpregnant women who are considered low risk for pelvic cancer and who do not have symptoms. Ask your health care provider if a screening pelvic exam is right for you.  If you have had past treatment for cervical cancer or a condition that could lead to cancer, you need Pap tests and screening for cancer for at least 20 years after your treatment. If Pap tests have been discontinued, your risk factors (such as having a new sexual partner) need to be reassessed to determine if screening should resume. Some women have medical problems that increase the chance of getting cervical cancer. In these cases, your health care provider may recommend more frequent screening and Pap tests.  Colorectal Cancer  This type of cancer can be detected and often prevented.  Routine colorectal cancer screening usually begins at 22 years of age and continues through 22 years of age.  Your health care provider may recommend screening at an earlier age if you have risk factors for colon cancer.  Your health care provider may also recommend using home test kits to check for hidden blood in the stool.  A small camera at the end of a tube can be used to examine your colon directly (sigmoidoscopy or colonoscopy). This is done to check for the earliest forms of colorectal  cancer.  Routine screening usually begins at age 33.  Direct examination of the colon should be repeated every 5-10 years through 22 years of age. However, you may need to be screened more often if early forms of precancerous polyps or small growths are found.  Skin Cancer  Check your skin from head to toe regularly.  Tell your health care provider about any new moles or changes in moles, especially if there is a change in a mole's shape or color.  Also tell your health care provider if you have a mole that is larger than the size of a pencil eraser.  Always use sunscreen. Apply sunscreen liberally and repeatedly throughout the day.  Protect yourself by wearing long sleeves, pants, a wide-brimmed hat, and sunglasses whenever you are outside.  Heart disease, diabetes, and high blood pressure  High blood pressure causes heart disease and increases the risk of stroke. High blood pressure is more likely to develop in: ? People who have blood pressure in the high end of  the normal range (130-139/85-89 mm Hg). ? People who are overweight or obese. ? People who are African American.  If you are 21-29 years of age, have your blood pressure checked every 3-5 years. If you are 3 years of age or older, have your blood pressure checked every year. You should have your blood pressure measured twice-once when you are at a hospital or clinic, and once when you are not at a hospital or clinic. Record the average of the two measurements. To check your blood pressure when you are not at a hospital or clinic, you can use: ? An automated blood pressure machine at a pharmacy. ? A home blood pressure monitor.  If you are between 17 years and 37 years old, ask your health care provider if you should take aspirin to prevent strokes.  Have regular diabetes screenings. This involves taking a blood sample to check your fasting blood sugar level. ? If you are at a normal weight and have a low risk for diabetes,  have this test once every three years after 22 years of age. ? If you are overweight and have a high risk for diabetes, consider being tested at a younger age or more often. Preventing infection Hepatitis B  If you have a higher risk for hepatitis B, you should be screened for this virus. You are considered at high risk for hepatitis B if: ? You were born in a country where hepatitis B is common. Ask your health care provider which countries are considered high risk. ? Your parents were born in a high-risk country, and you have not been immunized against hepatitis B (hepatitis B vaccine). ? You have HIV or AIDS. ? You use needles to inject street drugs. ? You live with someone who has hepatitis B. ? You have had sex with someone who has hepatitis B. ? You get hemodialysis treatment. ? You take certain medicines for conditions, including cancer, organ transplantation, and autoimmune conditions.  Hepatitis C  Blood testing is recommended for: ? Everyone born from [birth year] through 1965. ? Anyone with known risk factors for hepatitis C.  Sexually transmitted infections (STIs)  You should be screened for sexually transmitted infections (STIs) including gonorrhea and chlamydia if: ? You are sexually active and are younger than 22 years of age. ? You are older than 22 years of age and your health care provider tells you that you are at risk for this type of infection. ? Your sexual activity has changed since you were last screened and you are at an increased risk for chlamydia or gonorrhea. Ask your health care provider if you are at risk.  If you do not have HIV, but are at risk, it may be recommended that you take a prescription medicine daily to prevent HIV infection. This is called pre-exposure prophylaxis (PrEP). You are considered at risk if: ? You are sexually active and do not regularly use condoms or know the HIV status of your partner(s). ? You take drugs by injection. ? You are  sexually active with a partner who has HIV.  Talk with your health care provider about whether you are at high risk of being infected with HIV. If you choose to begin PrEP, you should first be tested for HIV. You should then be tested every 3 months for as long as you are taking PrEP. Pregnancy  If you are premenopausal and you may become pregnant, ask your health care provider about preconception counseling.  If you may become  pregnant, take 400 to 800 micrograms (mcg) of folic acid every day.  If you want to prevent pregnancy, talk to your health care provider about birth control (contraception). Osteoporosis and menopause  Osteoporosis is a disease in which the bones lose minerals and strength with aging. This can result in serious bone fractures. Your risk for osteoporosis can be identified using a bone density scan.  If you are 65 years of age or older, or if you are at risk for osteoporosis and fractures, ask your health care provider if you should be screened.  Ask your health care provider whether you should take a calcium or vitamin D supplement to lower your risk for osteoporosis.  Menopause may have certain physical symptoms and risks.  Hormone replacement therapy may reduce some of these symptoms and risks. Talk to your health care provider about whether hormone replacement therapy is right for you. Follow these instructions at home:  Schedule regular health, dental, and eye exams.  Stay current with your immunizations.  Do not use any tobacco products including cigarettes, chewing tobacco, or electronic cigarettes.  If you are pregnant, do not drink alcohol.  If you are breastfeeding, limit how much and how often you drink alcohol.  Limit alcohol intake to no more than 1 drink per day for nonpregnant women. One drink equals 12 ounces of beer, 5 ounces of wine, or 1 ounces of hard liquor.  Do not use street drugs.  Do not share needles.  Ask your health care  provider for help if you need support or information about quitting drugs.  Tell your health care provider if you often feel depressed.  Tell your health care provider if you have ever been abused or do not feel safe at home. This information is not intended to replace advice given to you by your health care provider. Make sure you discuss any questions you have with your health care provider. Document Released: 05/30/2011 Document Revised: 04/21/2016 Document Reviewed: 08/18/2015 Elsevier Interactive Patient Education  2018 [employer]. Etonogestrel implant What is this medicine? ETONOGESTREL (et oh noe JES trel) is a contraceptive (birth control) device. It is used to prevent pregnancy. It can be used for up to 3 years. This medicine may be used for other purposes; ask your health care provider or pharmacist if you have questions. COMMON BRAND NAME(S): Implanon, Nexplanon What should I tell my health care provider before I take this medicine? They need to know if you have any of these conditions: -abnormal vaginal bleeding -blood vessel disease or blood clots -cancer of the breast, cervix, or liver -depression -diabetes -gallbladder disease -headaches -heart disease or recent heart attack -high blood pressure -high cholesterol -kidney disease -liver disease -renal disease -seizures -tobacco smoker -an unusual or allergic reaction to etonogestrel, other hormones, anesthetics or antiseptics, medicines, foods, dyes, or preservatives -pregnant or trying to get pregnant -breast-feeding How should I use this medicine? This device is inserted just under the skin on the inner side of your upper arm by a health care professional. Talk to your pediatrician regarding the use of this medicine in children. Special care may be needed. Overdosage: If you think you have taken too much of this medicine contact a poison control center or emergency room at once. NOTE: This medicine is only for  you. Do not share this medicine with others. What if I miss a dose? This does not apply. What may interact with this medicine? Do not take this medicine with any of the following   medications: -amprenavir -bosentan -fosamprenavir This medicine may also interact with the following medications: -barbiturate medicines for inducing sleep or treating seizures -certain medicines for fungal infections like ketoconazole and itraconazole -grapefruit juice -griseofulvin -medicines to treat seizures like carbamazepine, felbamate, oxcarbazepine, phenytoin, topiramate -modafinil -phenylbutazone -rifampin -rufinamide -some medicines to treat HIV infection like atazanavir, indinavir, lopinavir, nelfinavir, tipranavir, ritonavir -St. John's wort This list may not describe all possible interactions. Give your health care provider a list of all the medicines, herbs, non-prescription drugs, or dietary supplements you use. Also tell them if you smoke, drink alcohol, or use illegal drugs. Some items may interact with your medicine. What should I watch for while using this medicine? This product does not protect you against HIV infection (AIDS) or other sexually transmitted diseases. You should be able to feel the implant by pressing your fingertips over the skin where it was inserted. Contact your doctor if you cannot feel the implant, and use a non-hormonal birth control method (such as condoms) until your doctor confirms that the implant is in place. If you feel that the implant may have broken or become bent while in your arm, contact your healthcare provider. What side effects may I notice from receiving this medicine? Side effects that you should report to your doctor or health care professional as soon as possible: -allergic reactions like skin rash, itching or hives, swelling of the face, lips, or tongue -breast lumps -changes in emotions or moods -depressed mood -heavy or prolonged menstrual  bleeding -pain, irritation, swelling, or bruising at the insertion site -scar at site of insertion -signs of infection at the insertion site such as fever, and skin redness, pain or discharge -signs of pregnancy -signs and symptoms of a blood clot such as breathing problems; changes in vision; chest pain; severe, sudden headache; pain, swelling, warmth in the leg; trouble speaking; sudden numbness or weakness of the face, arm or leg -signs and symptoms of liver injury like dark yellow or brown urine; general ill feeling or flu-like symptoms; light-colored stools; loss of appetite; nausea; right upper belly pain; unusually weak or tired; yellowing of the eyes or skin -unusual vaginal bleeding, discharge -signs and symptoms of a stroke like changes in vision; confusion; trouble speaking or understanding; severe headaches; sudden numbness or weakness of the face, arm or leg; trouble walking; dizziness; loss of balance or coordination Side effects that usually do not require medical attention (report to your doctor or health care professional if they continue or are bothersome): -acne -back pain -breast pain -changes in weight -dizziness -general ill feeling or flu-like symptoms -headache -irregular menstrual bleeding -nausea -sore throat -vaginal irritation or inflammation This list may not describe all possible side effects. Call your doctor for medical advice about side effects. You may report side effects to FDA at 1-800-FDA-1088. Where should I keep my medicine? This drug is given in a hospital or clinic and will not be stored at home. NOTE: This sheet is a summary. It may not cover all possible information. If you have questions about this medicine, talk to your doctor, pharmacist, or health care provider.  2018 Elsevier/Gold Standard (2016-06-02 11:19:22)  

## 2018-09-25 LAB — CBC WITH DIFFERENTIAL/PLATELET
Basophils Absolute: 50 cells/uL (ref 0–200)
Basophils Relative: 0.6 %
EOS ABS: 92 {cells}/uL (ref 15–500)
Eosinophils Relative: 1.1 %
HCT: 41.4 % (ref 35.0–45.0)
Hemoglobin: 14.2 g/dL (ref 11.7–15.5)
LYMPHS ABS: 2621 {cells}/uL (ref 850–3900)
MCH: 30.5 pg (ref 27.0–33.0)
MCHC: 34.3 g/dL (ref 32.0–36.0)
MCV: 89 fL (ref 80.0–100.0)
MPV: 10.2 fL (ref 7.5–12.5)
Monocytes Relative: 5.6 %
NEUTROS PCT: 61.5 %
Neutro Abs: 5166 cells/uL (ref 1500–7800)
PLATELETS: 297 10*3/uL (ref 140–400)
RBC: 4.65 10*6/uL (ref 3.80–5.10)
RDW: 12.3 % (ref 11.0–15.0)
TOTAL LYMPHOCYTE: 31.2 %
WBC: 8.4 10*3/uL (ref 3.8–10.8)
WBCMIX: 470 {cells}/uL (ref 200–950)

## 2018-09-25 LAB — PAP IG W/ RFLX HPV ASCU

## 2018-09-25 LAB — C. TRACHOMATIS/N. GONORRHOEAE RNA
C. TRACHOMATIS RNA, TMA: NOT DETECTED
N. gonorrhoeae RNA, TMA: NOT DETECTED

## 2018-09-25 LAB — HEPATITIS B SURFACE ANTIGEN: Hepatitis B Surface Ag: NONREACTIVE

## 2018-09-25 LAB — HIV ANTIBODY (ROUTINE TESTING W REFLEX): HIV 1&2 Ab, 4th Generation: NONREACTIVE

## 2018-09-25 LAB — RPR: RPR Ser Ql: NONREACTIVE

## 2018-09-25 LAB — HEPATITIS C ANTIBODY
Hepatitis C Ab: NONREACTIVE
SIGNAL TO CUT-OFF: 0.01 (ref <1.00)

## 2018-09-26 LAB — URINALYSIS, COMPLETE W/RFL CULTURE
Bilirubin Urine: NEGATIVE
GLUCOSE, UA: NEGATIVE
LEUKOCYTE ESTERASE: NEGATIVE
NITRITES URINE, INITIAL: NEGATIVE
PH: 5.5 (ref 5.0–8.0)
Specific Gravity, Urine: 1.025 (ref 1.001–1.03)

## 2018-09-26 LAB — URINE CULTURE
MICRO NUMBER: 91299581
SPECIMEN QUALITY: ADEQUATE

## 2018-09-26 LAB — CULTURE INDICATED

## 2018-12-10 ENCOUNTER — Encounter: Payer: Self-pay | Admitting: Obstetrics & Gynecology

## 2018-12-10 ENCOUNTER — Ambulatory Visit (INDEPENDENT_AMBULATORY_CARE_PROVIDER_SITE_OTHER): Payer: 59 | Admitting: Obstetrics & Gynecology

## 2018-12-10 DIAGNOSIS — Z3046 Encounter for surveillance of implantable subdermal contraceptive: Secondary | ICD-10-CM

## 2018-12-10 DIAGNOSIS — Z30017 Encounter for initial prescription of implantable subdermal contraceptive: Secondary | ICD-10-CM

## 2018-12-10 NOTE — Progress Notes (Signed)
Mallory Myers 02-28-1996 [id number]        23 y.o.  G0P0000   RP: Nexplanon removal and insertion  HPI: Has had a few Nexplanons.  This Nexplanon was inserted 11/2015, time to change.  Well on it with occasional mild menses.  No pelvic pain.   OB History  Gravida Para Term Preterm AB Living  0 0 0 0 0 0  SAB TAB Ectopic Multiple Live Births  0 0 0 0      Past medical history,surgical history, problem list, medications, allergies, family history and social history were all reviewed and documented in the EPIC chart.   Directed ROS with pertinent positives and negatives documented in the history of present illness/assessment and plan.  Exam:  There were no vitals filed for this visit. General appearance:  Normal                                                             Nexplanon procedure note (removal)  The patient presented to the office today requesting for removal of her Nexplanon that was placed in the year [id number] on her Left arm.   On examination the nexplanon implant was palpated and the distal end  (end  closest to the elbow) was marked. The area was sterilized with Betadine solution. 1% lidocaine was used for local anesthesia and approximately 1 cc  was injected into the site that was marked where the incision was to be made. The local anesthetic was injected under the implant in an effort to keep it  close to the skin surface. Slight pressure pushing downward was made at the proximal end  of the implant in an effort to stabilize it. A bulge appeared indicating the distal end of the implant. A small transverse incision of 2 mm was made at that location. By gently pushing the implant toward the incision, the tip became visible. Grasping the implant with a curved forcep facilitated in gently removing the implant. Full confirmation of the entire implant which is 4 cm long was inspected and was intact and was shown to the patient and discarded. After removing the implant, the  incision was closed with 3 Steri-Strips, a band-aid and a bandage. Patient will be instructed to remove the pressure bandage in 24 hours, the band-aid in 3 days and the Steri-Strips in 7 days.                                               Nexplanon Procedure Note (insertion)   The patient was laying on her back with her nondominant arm flexed at the elbow and externally rotated. The insertion site was identified as the underside of the nondominant upper arm approximately 8 cm from the medial epicondyle of the humerus, at a different site from the previous Nexplanon, to be on top of the Triceps.  A mark was made with a sterile marker at the spot where the Nexplanon implant will be inserted. The area was cleansed with Betadine solution. The area was anesthetized with 1% lidocaine  (1 cc)  at the area the injection site and underneath the skin along the planned insertion tunnel. The  preloaded disposable Nexplanon was removed from its sterile casing.  The applicator was held above the needle at the textured surface area. The transparent protector was removed. With a freehand, the skin was stretched around the insertion site with a thumb and index finger. The skin was then punctured with the tip of the needle angled at 30. The Nexplanon applicator was lowered to a horizontal position. While lifting the skin with the tip of the needle the needle was then slid to its full length. The applicator was kept in this position with the needle inserted to its full length. The purple slider was unlocked by pushing it slightly downward. The slider was fully moved back until it stopped. This allowed the implant to be in the final subdermal position and the needle to be locked inside the body of the applicator. The applicator was then removed. 3 Steri-Strips were applied over the incision, a band-aid and a bandage was placed which the patient is to remove tomorrow. No complications, the patient tolerated procedure well and was  released home with instructions.   Assessment/Plan:  23 y.o. G0P0000   1. Encounter for Nexplanon removal Nexplanon removal, well-tolerated, without complication.  2. Nexplanon insertion Easy Nexplanon insertion at the recommended location superficially on top of the triceps.  Well-tolerated.  No complication.  Genia DelMarie-Lyne Carley Glendenning MD, 3:32 PM 12/10/2018

## 2018-12-10 NOTE — Patient Instructions (Signed)
1. Encounter for Nexplanon removal Nexplanon removal, well-tolerated, without complication.  2. Nexplanon insertion Easy Nexplanon insertion at the recommended location superficially on top of the triceps.  Well-tolerated.  No complication.  Felicie, it was a pleasure meeting you today!

## 2018-12-14 ENCOUNTER — Encounter: Payer: Self-pay | Admitting: Anesthesiology

## 2019-01-08 ENCOUNTER — Ambulatory Visit (INDEPENDENT_AMBULATORY_CARE_PROVIDER_SITE_OTHER): Payer: 59 | Admitting: Women's Health

## 2019-01-08 ENCOUNTER — Encounter: Payer: Self-pay | Admitting: Women's Health

## 2019-01-08 VITALS — BP 110/70

## 2019-01-08 DIAGNOSIS — N898 Other specified noninflammatory disorders of vagina: Secondary | ICD-10-CM

## 2019-01-08 LAB — WET PREP FOR TRICH, YEAST, CLUE

## 2019-01-08 MED ORDER — TERCONAZOLE 0.4 % VA CREA: 1.0000 | TOPICAL_CREAM | Freq: Every day | VAGINAL | 0 refills | Status: DC

## 2019-01-08 NOTE — Progress Notes (Signed)
23 year old S WF G0 presents with complaint of vaginal/rectal itching with a burning sensation for the past 1 to 2 weeks.  Denies pain or burning with urination, abdominal/back pain, nausea or fever.  12/10/2018 Nexplanon removed and replaced amenorrheic.  Same partner.  No known health problems.  Exam: Appears well.  No CVAT.  Abdomen soft nontender, external genitalia no visible lesions, erythema, discharge.  Speculum exam scant white discharge without odor or erythema noted, wet prep negative.  Bimanual no CMT or adnexal tenderness.  Vaginal itching  Plan: Reviewed normality of exam and wet prep, will try Terazol 7 1 applicator per vagina and then small amount externally at bedtime for several nights.    Instructed to call if continued symptoms.

## 2019-10-07 ENCOUNTER — Other Ambulatory Visit: Payer: Self-pay

## 2019-10-08 ENCOUNTER — Encounter: Payer: Self-pay | Admitting: Obstetrics & Gynecology

## 2019-10-08 ENCOUNTER — Ambulatory Visit (INDEPENDENT_AMBULATORY_CARE_PROVIDER_SITE_OTHER): Payer: 59 | Admitting: Obstetrics & Gynecology

## 2019-10-08 VITALS — BP 114/70 | Ht 62.0 in | Wt 102.0 lb

## 2019-10-08 DIAGNOSIS — Z3046 Encounter for surveillance of implantable subdermal contraceptive: Secondary | ICD-10-CM

## 2019-10-08 DIAGNOSIS — Z113 Encounter for screening for infections with a predominantly sexual mode of transmission: Secondary | ICD-10-CM

## 2019-10-08 DIAGNOSIS — Z01419 Encounter for gynecological examination (general) (routine) without abnormal findings: Secondary | ICD-10-CM | POA: Diagnosis not present

## 2019-10-08 NOTE — Addendum Note (Signed)
Addended by: Thurnell Garbe A on: 10/08/2019 11:39 AM   Modules accepted: Orders

## 2019-10-08 NOTE — Progress Notes (Signed)
Mallory Myers [birth year] [id number]   History:    23 y.o. G0 Stable boyfriend.  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   RP:  Established patient presenting for annual gyn exam   HPI: Well on Nexplanon since insertion January 2020.  Very light spaced menses.  No pelvic pain.  No pain with intercourse.  Urine and bowel movements normal.  Breasts normal.  Body mass index 18.66.  Fit with yoga and healthy nutrition.  Past medical history,surgical history, family history and social history were all reviewed and documented in the EPIC chart.  Gynecologic History No LMP recorded. Patient has had an implant. Contraception: Nexplanon and x 11/2018 Last Pap: 08/2018. Results were: Negative Last mammogram: Never Bone Density: Never Colonoscopy: Never  Obstetric History OB History  Gravida Para Term Preterm AB Living  0 0 0 0 0 0  SAB TAB Ectopic Multiple Live Births  0 0 0 0    0.66.   ROS: A ROS was performed and pertinent positives and negatives are included in the history.  GENERAL: No fevers or chills. HEENT: No change in vision, no earache, sore throat or sinus congestion. NECK: No pain or stiffness. CARDIOVASCULAR: No chest pain or pressure. No palpitations. PULMONARY: No shortness of breath, cough or wheeze. GASTROINTESTINAL: No abdominal pain, nausea, vomiting or diarrhea, melena or bright red blood per rectum. GENITOURINARY: No urinary frequency, urgency, hesitancy or dysuria. MUSCULOSKELETAL: No joint or muscle pain, no back pain, no recent trauma. DERMATOLOGIC: No rash, no itching, no lesions. ENDOCRINE: No polyuria, polydipsia, no heat or cold intolerance. No recent change in weight. HEMATOLOGICAL: No anemia or easy bruising or bleeding. NEUROLOGIC: No headache, seizures, numbness, tingling or weakness. PSYCHIATRIC: No depression, no loss of interest in normal activity or change in sleep pattern.     Exam:   BP 114/70   Ht 5\' 2"  (1.575 m)   Wt 102 lb (46.3 kg)   BMI 18.66  kg/m   Body mass index is 18.66 kg/m.  General appearance : Well developed well nourished female. No acute distress HEENT: Eyes: no retinal hemorrhage or exudates,  Neck supple, trachea midline, no carotid bruits, no thyroidmegaly Lungs: Clear to auscultation, no rhonchi or wheezes, or rib retractions  Heart: Regular rate and rhythm, no murmurs or gallops Breast:Examined in sitting and supine position were symmetrical in appearance, no palpable masses or tenderness,  no skin retraction, no nipple inversion, no nipple discharge, no skin discoloration, no axillary or supraclavicular lymphadenopathy Abdomen: no palpable masses or tenderness, no rebound or guarding Extremities: no edema or skin discoloration or tenderness  Pelvic: Vulva: Normal             Vagina: No gross lesions or discharge  Cervix: No gross lesions or discharge.  Pap reflex/Gono-Chlam done.  Uterus  AV, normal size, shape and consistency, non-tender and mobile  Adnexa  Without masses or tenderness  Anus: Normal   Assessment/Plan:  23 y.o. female for annual exam   1. Well woman exam with routine gynecological exam Normal gynecologic exam.  Pap reflex done.  Breast exam normal.  Body mass index 18.66.  Patient is fit with yoga and hiking.  2. Encounter for surveillance of implantable subdermal contraceptive Well on Nexplanon since insertion January 2020.  3. Screen for STD (sexually transmitted disease) Recommend condom use. - Gonorrhea and Chlamydia on Pap - HIV antibody (with reflex) - RPR - Hepatitis C Antibody - Hepatitis B Surface AntiGEN  Princess Bruins MD, 11:06 AM  10/08/2019   

## 2019-10-08 NOTE — Patient Instructions (Signed)
1. Well woman exam with routine gynecological exam Normal gynecologic exam.  Pap reflex done.  Breast exam normal.  Body mass index 18.66.  Patient is fit with yoga and hiking.  2. Encounter for surveillance of implantable subdermal contraceptive Well on Nexplanon since insertion January 2020.  3. Screen for STD (sexually transmitted disease) Recommend condom use. - Gonorrhea and Chlamydia on Pap - HIV antibody (with reflex) - RPR - Hepatitis C Antibody - Hepatitis B Surface AntiGEN  Mallory Myers, it was a pleasure seeing you today!  I will inform you of your results as soon as they are available.

## 2019-10-09 LAB — HEPATITIS C ANTIBODY
Hepatitis C Ab: NONREACTIVE
SIGNAL TO CUT-OFF: 0.01 (ref <1.00)

## 2019-10-09 LAB — PAP THINPREP ASCUS RFLX HPV RFLX TYPE
C. trachomatis RNA, TMA: NOT DETECTED
N. gonorrhoeae RNA, TMA: NOT DETECTED

## 2019-10-09 LAB — RPR: RPR Ser Ql: NONREACTIVE

## 2019-10-09 LAB — HIV ANTIBODY (ROUTINE TESTING W REFLEX): HIV 1&2 Ab, 4th Generation: NONREACTIVE

## 2019-10-09 LAB — HEPATITIS B SURFACE ANTIGEN: Hepatitis B Surface Ag: NONREACTIVE

## 2019-10-11 ENCOUNTER — Encounter: Payer: Self-pay | Admitting: *Deleted

## 2020-10-12 ENCOUNTER — Encounter: Payer: 59 | Admitting: Obstetrics & Gynecology

## 2020-10-12 ENCOUNTER — Encounter: Payer: Self-pay | Admitting: Obstetrics & Gynecology

## 2020-10-12 ENCOUNTER — Other Ambulatory Visit: Payer: Self-pay

## 2020-10-12 ENCOUNTER — Ambulatory Visit (INDEPENDENT_AMBULATORY_CARE_PROVIDER_SITE_OTHER): Payer: 59 | Admitting: Obstetrics & Gynecology

## 2020-10-12 VITALS — BP 102/68 | Ht 62.0 in | Wt 104.0 lb

## 2020-10-12 DIAGNOSIS — Z3046 Encounter for surveillance of implantable subdermal contraceptive: Secondary | ICD-10-CM | POA: Diagnosis not present

## 2020-10-12 DIAGNOSIS — Z01419 Encounter for gynecological examination (general) (routine) without abnormal findings: Secondary | ICD-10-CM

## 2020-10-12 DIAGNOSIS — Z30011 Encounter for initial prescription of contraceptive pills: Secondary | ICD-10-CM

## 2020-10-12 MED ORDER — NORETHIN ACE-ETH ESTRAD-FE 1-20 MG-MCG PO TABS: 1.0000 | ORAL_TABLET | Freq: Every day | ORAL | 4 refills | Status: DC

## 2020-10-12 NOTE — Addendum Note (Signed)
Addended by: Berna Spare A on: 10/12/2020 02:20 PM   Modules accepted: Orders

## 2020-10-12 NOTE — Progress Notes (Signed)
Mallory Myers [birth year] [id number]   History:    24 y.o. G0  Stable boyfriend.  Started a Building control surveyor business.  RP:  Established patient presenting for annual gyn exam   HPI: On Nexplanon since January 2020.  Very light spaced menses.  Mood swings on Nexplanon, wants to remove it and try a BCP.  No pelvic pain.  No pain with intercourse.  Urine and bowel movements normal.  Breasts normal.  Body mass index 19.02.  Fit with yoga and healthy nutrition.  Past medical history,surgical history, family history and social history were all reviewed and documented in the EPIC chart.  Gynecologic History Patient's last menstrual period was 10/11/2020.  Obstetric History OB History  Gravida Para Term Preterm AB Living  0 0 0 0 0 0  SAB TAB Ectopic Multiple Live Births  0 0 0 0       ROS: A ROS was performed and pertinent positives and negatives are included in the history.  GENERAL: No fevers or chills. HEENT: No change in vision, no earache, sore throat or sinus congestion. NECK: No pain or stiffness. CARDIOVASCULAR: No chest pain or pressure. No palpitations. PULMONARY: No shortness of breath, cough or wheeze. GASTROINTESTINAL: No abdominal pain, nausea, vomiting or diarrhea, melena or bright red blood per rectum. GENITOURINARY: No urinary frequency, urgency, hesitancy or dysuria. MUSCULOSKELETAL: No joint or muscle pain, no back pain, no recent trauma. DERMATOLOGIC: No rash, no itching, no lesions. ENDOCRINE: No polyuria, polydipsia, no heat or cold intolerance. No recent change in weight. HEMATOLOGICAL: No anemia or easy bruising or bleeding. NEUROLOGIC: No headache, seizures, numbness, tingling or weakness. PSYCHIATRIC: No depression, no loss of interest in normal activity or change in sleep pattern.     Exam:   BP 102/68   Ht 5\' 2"  (1.575 m)   Wt 104 lb (47.2 kg)   LMP 10/11/2020   BMI 19.02 kg/m   Body mass index is 19.02 kg/m.  General appearance : Well developed  well nourished female. No acute distress HEENT: Eyes: no retinal hemorrhage or exudates,  Neck supple, trachea midline, no carotid bruits, no thyroidmegaly Lungs: Clear to auscultation, no rhonchi or wheezes, or rib retractions  Heart: Regular rate and rhythm, no murmurs or gallops Breast:Examined in sitting and supine position were symmetrical in appearance, no palpable masses or tenderness,  no skin retraction, no nipple inversion, no nipple discharge, no skin discoloration, no axillary or supraclavicular lymphadenopathy Abdomen: no palpable masses or tenderness, no rebound or guarding Extremities: no edema or skin discoloration or tenderness  Pelvic: Vulva: Normal             Vagina: No gross lesions or discharge  Cervix: No gross lesions or discharge.  Pap reflex done.  Uterus  AV, normal size, shape and consistency, non-tender and mobile  Adnexa  Without masses or tenderness  Anus: Normal                                                             Nexplanon procedure note (removal)  The patient presented to the office today requesting for removal of her Nexplanon that was placed in the year 2020 on her left arm.   On examination the nexplanon implant was palpated and the distal end  (end  closest  to the elbow) was marked. The area was sterilized with Betadine solution. 1% lidocaine was used for local anesthesia and approximately 1 cc  was injected into the site that was marked where the incision was to be made. The local anesthetic was injected under the implant in an effort to keep it  close to the skin surface. Slight pressure pushing downward was made at the proximal end  of the implant in an effort to stabilize it. A bulge appeared indicating the distal end of the implant. A small transverse incision of 2 mm was made at that location. By gently pushing the implant toward the incision, the tip became visible. Grasping the implant with a curved forcep facilitated in gently removing the  implant. Full confirmation of the entire implant which is 4 cm long was inspected and was intact and was shown to the patient and discarded. After removing the implant, the incision was closed with 3 Steri-Strips, a band-aid and a bandage. Patient will be instructed to remove the pressure bandage in 24 hours, the band-aid in 3 days and the Steri-Strips in 7 days.    Assessment/Plan:  24 y.o. female for annual exam   1. Encounter for routine gynecological examination with Papanicolaou smear of cervix Normal gynecologic exam.  Pap reflex done today.  Breasts normal.  BMI good at 19.02.  Continue with fitness, Yoga instructor, and healthy nutrition.  2. Encounter for Nexplanon removal Easy removal of Nexplanon.  No complication.  Well tolerated by patient.  Post procedure precautions discussed.  3. Encounter for initial prescription of contraceptive pills Decision to start on BCPs for contraception.  Usage, risks and benefits thoroughly reviewed.  No CI.  Prescription sent to pharmacy.  Other orders - norethindrone-ethinyl estradiol (LOESTRIN FE) 1-20 MG-MCG tablet; Take 1 tablet by mouth daily.  Genia Del MD, 12:28 PM 10/12/2020

## 2020-10-13 LAB — PAP IG W/ RFLX HPV ASCU

## 2020-11-27 ENCOUNTER — Observation Stay (HOSPITAL_BASED_OUTPATIENT_CLINIC_OR_DEPARTMENT_OTHER)
Admission: EM | Admit: 2020-11-27 | Discharge: 2020-11-29 | Disposition: A | Discharge status: Home / Self Care | Payer: 59 | Attending: Emergency Medicine | Admitting: Emergency Medicine

## 2020-11-27 ENCOUNTER — Emergency Department (HOSPITAL_COMMUNITY)
Admission: EM | Admit: 2020-11-27 | Discharge: 2020-11-27 | Disposition: A | Discharge status: Home / Self Care | Payer: 59 | Source: Home / Self Care

## 2020-11-27 ENCOUNTER — Inpatient Hospital Stay (HOSPITAL_COMMUNITY): Admission: AD | Admit: 2020-11-27 | Payer: 59 | Source: Home / Self Care | Admitting: Obstetrics and Gynecology

## 2020-11-27 ENCOUNTER — Encounter (HOSPITAL_COMMUNITY): Payer: Self-pay | Admitting: Emergency Medicine

## 2020-11-27 ENCOUNTER — Other Ambulatory Visit: Payer: Self-pay

## 2020-11-27 ENCOUNTER — Encounter (HOSPITAL_BASED_OUTPATIENT_CLINIC_OR_DEPARTMENT_OTHER): Payer: Self-pay | Admitting: *Deleted

## 2020-11-27 ENCOUNTER — Emergency Department: Admit: 2020-11-27 | Discharge status: Absent without leave | Payer: Self-pay

## 2020-11-27 DIAGNOSIS — R1110 Vomiting, unspecified: Secondary | ICD-10-CM | POA: Insufficient documentation

## 2020-11-27 DIAGNOSIS — O2301 Infections of kidney in pregnancy, first trimester: Secondary | ICD-10-CM | POA: Diagnosis not present

## 2020-11-27 DIAGNOSIS — Z5321 Procedure and treatment not carried out due to patient leaving prior to being seen by health care provider: Secondary | ICD-10-CM | POA: Insufficient documentation

## 2020-11-27 DIAGNOSIS — N12 Tubulo-interstitial nephritis, not specified as acute or chronic: Principal | ICD-10-CM | POA: Insufficient documentation

## 2020-11-27 DIAGNOSIS — Z20822 Contact with and (suspected) exposure to covid-19: Secondary | ICD-10-CM | POA: Diagnosis not present

## 2020-11-27 DIAGNOSIS — Z3A01 Less than 8 weeks gestation of pregnancy: Secondary | ICD-10-CM | POA: Diagnosis not present

## 2020-11-27 DIAGNOSIS — Z3A00 Weeks of gestation of pregnancy not specified: Secondary | ICD-10-CM | POA: Diagnosis not present

## 2020-11-27 DIAGNOSIS — E860 Dehydration: Secondary | ICD-10-CM

## 2020-11-27 DIAGNOSIS — O3680X0 Pregnancy with inconclusive fetal viability, not applicable or unspecified: Secondary | ICD-10-CM

## 2020-11-27 DIAGNOSIS — O2300 Infections of kidney in pregnancy, unspecified trimester: Secondary | ICD-10-CM | POA: Diagnosis not present

## 2020-11-27 LAB — BASIC METABOLIC PANEL
Anion gap: 16 — ABNORMAL HIGH (ref 5–15)
BUN: 27 mg/dL — ABNORMAL HIGH (ref 6–20)
CO2: 20 mmol/L — ABNORMAL LOW (ref 22–32)
Calcium: 10.1 mg/dL (ref 8.9–10.3)
Chloride: 102 mmol/L (ref 98–111)
Creatinine, Ser: 1 mg/dL (ref 0.44–1.00)
GFR, Estimated: >60 mL/min (ref >60)
Glucose, Bld: 168 mg/dL — ABNORMAL HIGH (ref 70–99)
Potassium: 3.2 mmol/L — ABNORMAL LOW (ref 3.5–5.1)
Sodium: 138 mmol/L (ref 135–145)

## 2020-11-27 LAB — CBC
HCT: 40.7 % (ref 36.0–46.0)
Hemoglobin: 14.7 g/dL (ref 12.0–15.0)
MCH: 31.1 pg (ref 26.0–34.0)
MCHC: 36.1 g/dL — ABNORMAL HIGH (ref 30.0–36.0)
MCV: 86.2 fL (ref 80.0–100.0)
Platelets: 388 10*3/uL (ref 150–400)
RBC: 4.72 MIL/uL (ref 3.87–5.11)
RDW: 12 % (ref 11.5–15.5)
WBC: 17.2 10*3/uL — ABNORMAL HIGH (ref 4.0–10.5)
nRBC: 0 % (ref 0.0–0.2)

## 2020-11-27 LAB — CBC WITH DIFFERENTIAL/PLATELET
Abs Immature Granulocytes: 0.09 10*3/uL — ABNORMAL HIGH (ref 0.00–0.07)
Basophils Absolute: 0 10*3/uL (ref 0.0–0.1)
Basophils Relative: 0 %
Eosinophils Absolute: 0 10*3/uL (ref 0.0–0.5)
Eosinophils Relative: 0 %
HCT: 39.3 % (ref 36.0–46.0)
Hemoglobin: 14.4 g/dL (ref 12.0–15.0)
Immature Granulocytes: 1 %
Lymphocytes Relative: 5 %
Lymphs Abs: 1 10*3/uL (ref 0.7–4.0)
MCH: 31.3 pg (ref 26.0–34.0)
MCHC: 36.6 g/dL — ABNORMAL HIGH (ref 30.0–36.0)
MCV: 85.4 fL (ref 80.0–100.0)
Monocytes Absolute: 0.5 10*3/uL (ref 0.1–1.0)
Monocytes Relative: 3 %
Neutro Abs: 17.4 10*3/uL — ABNORMAL HIGH (ref 1.7–7.7)
Neutrophils Relative %: 91 %
Platelets: 370 10*3/uL (ref 150–400)
RBC: 4.6 MIL/uL (ref 3.87–5.11)
RDW: 12.1 % (ref 11.5–15.5)
WBC: 19 10*3/uL — ABNORMAL HIGH (ref 4.0–10.5)
nRBC: 0 % (ref 0.0–0.2)

## 2020-11-27 LAB — RESP PANEL BY RT-PCR (FLU A&B, COVID) ARPGX2
Influenza A by PCR: NEGATIVE
Influenza B by PCR: NEGATIVE
SARS Coronavirus 2 by RT PCR: NEGATIVE

## 2020-11-27 LAB — URINALYSIS, MICROSCOPIC (REFLEX)

## 2020-11-27 LAB — URINALYSIS, ROUTINE W REFLEX MICROSCOPIC
Bilirubin Urine: NEGATIVE
Glucose, UA: NEGATIVE mg/dL
Ketones, ur: 80 mg/dL — AB
Leukocytes,Ua: NEGATIVE
Nitrite: NEGATIVE
Protein, ur: 100 mg/dL — AB
Specific Gravity, Urine: 1.03 (ref 1.005–1.030)
pH: 6 (ref 5.0–8.0)

## 2020-11-27 LAB — LIPASE, BLOOD: Lipase: 20 U/L (ref 11–51)

## 2020-11-27 LAB — COMPREHENSIVE METABOLIC PANEL
ALT: 21 U/L (ref 0–44)
AST: 23 U/L (ref 15–41)
Albumin: 5.2 g/dL — ABNORMAL HIGH (ref 3.5–5.0)
Alkaline Phosphatase: 43 U/L (ref 38–126)
Anion gap: 16 — ABNORMAL HIGH (ref 5–15)
BUN: 22 mg/dL — ABNORMAL HIGH (ref 6–20)
CO2: 19 mmol/L — ABNORMAL LOW (ref 22–32)
Calcium: 9.9 mg/dL (ref 8.9–10.3)
Chloride: 102 mmol/L (ref 98–111)
Creatinine, Ser: 0.91 mg/dL (ref 0.44–1.00)
GFR, Estimated: >60 mL/min (ref >60)
Glucose, Bld: 170 mg/dL — ABNORMAL HIGH (ref 70–99)
Potassium: 3.9 mmol/L (ref 3.5–5.1)
Sodium: 137 mmol/L (ref 135–145)
Total Bilirubin: 1.4 mg/dL — ABNORMAL HIGH (ref 0.3–1.2)
Total Protein: 8.4 g/dL — ABNORMAL HIGH (ref 6.5–8.1)

## 2020-11-27 LAB — LACTIC ACID, PLASMA
Lactic Acid, Venous: 2.7 mmol/L (ref 0.5–1.9)
Lactic Acid, Venous: 1.4 mmol/L (ref 0.5–1.9)

## 2020-11-27 LAB — HCG, QUANTITATIVE, PREGNANCY: hCG, Beta Chain, Quant, S: 57615 m[IU]/mL — ABNORMAL HIGH (ref <5)

## 2020-11-27 LAB — PREGNANCY, URINE: Preg Test, Ur: POSITIVE — AB

## 2020-11-27 LAB — I-STAT BETA HCG BLOOD, ED (MC, WL, AP ONLY): I-stat hCG, quantitative: >2000 m[IU]/mL — ABNORMAL HIGH (ref <5)

## 2020-11-27 MED ORDER — SODIUM CHLORIDE 0.9 % IV BOLUS
1000.0000 mL | Freq: Once | INTRAVENOUS | Status: AC
  Administered 2020-11-27: 1000 mL via INTRAVENOUS

## 2020-11-27 MED ORDER — PROMETHAZINE HCL 25 MG/ML IJ SOLN
25.0000 mg | Freq: Once | INTRAMUSCULAR | Status: AC
  Administered 2020-11-27: 25 mg via INTRAVENOUS
  Filled 2020-11-27: qty 1

## 2020-11-27 MED ORDER — POTASSIUM CHLORIDE 20 MEQ PO PACK
40.0000 meq | PACK | Freq: Every day | ORAL | Status: DC
  Administered 2020-11-27 – 2020-11-28 (×2): 40 meq via ORAL
  Filled 2020-11-27 (×3): qty 2

## 2020-11-27 MED ORDER — SODIUM CHLORIDE 0.9 % IV SOLN
1.0000 g | Freq: Once | INTRAVENOUS | Status: AC
  Administered 2020-11-27: 1 g via INTRAVENOUS
  Filled 2020-11-27: qty 10

## 2020-11-27 MED ORDER — POTASSIUM CHLORIDE CRYS ER 20 MEQ PO TBCR
40.0000 meq | EXTENDED_RELEASE_TABLET | Freq: Once | ORAL | Status: DC
  Filled 2020-11-27: qty 2

## 2020-11-27 NOTE — ED Triage Notes (Addendum)
Pt reports 3 days of vomiting. Reports LMP ~3 months ago. She had a + preg test yesterday. She reports she feels dehydrated. She went to [facility] prior to coming here and had blood drawn

## 2020-11-27 NOTE — ED Triage Notes (Signed)
Per pt, states she starting vomiting yesterday-took pregnancy test and it was positive-states LMP 2-3 months ago-had implant but it was taken out in November-started taking birth control pill-states ears are clogged as well

## 2020-11-27 NOTE — ED Notes (Signed)
Pt ambulatory to bathroom by self.  ° °

## 2020-11-27 NOTE — ED Provider Notes (Signed)
[facility] EMERGENCY DEPARTMENT Provider Note   CSN: [id number] Arrival date & time: 11/27/20  1327     History Chief Complaint  Patient presents with  . Emesis    +preg test    Mallory Myers is a 24 y.o. female.  Patient presents ER chief complaint nausea vomiting generalized malaise.  Symptoms been ongoing for 3 to 4 days.  She found out she was pregnant yesterday.  She was at [facility] yesterday had blood drawn but left without being seen.  Presents to the ER at this facility for further evaluation.        Past Medical History:  Diagnosis Date  . Seasonal allergies     Patient Active Problem List   Diagnosis Date Noted  . Pyelonephritis 11/27/2020  . Nexplanon in place 12/09/2015  . Yeast vaginitis 03/25/2015  . NECK PAIN, ACUTE 05/12/2011  . TORTICOLLIS 05/12/2011  . RIB PAIN 05/04/2011    Past Surgical History:  Procedure Laterality Date  . TYMPANOSTOMY TUBE PLACEMENT  1999     OB History    Gravida  1   Para  0   Term  0   Preterm  0   AB  0   Living  0     SAB  0   IAB  0   Ectopic  0   Multiple  0   Live Births              Family History  Problem Relation Age of Onset  . Depression Mother   . Anxiety disorder Mother   . Hypertension Mother   . Asthma Mother   . Alcohol abuse Other     Social History   Tobacco Use  . Smoking status: Never Smoker  . Smokeless tobacco: Never Used  Vaping Use  . Vaping Use: Never used  Substance Use Topics  . Alcohol use: No    Alcohol/week: 0.0 standard drinks  . Drug use: Yes    Types: Marijuana    Home Medications Prior to Admission medications   Medication Sig Start Date End Date Taking? Authorizing Provider  norethindrone-ethinyl estradiol (LOESTRIN FE) 1-20 MG-MCG tablet Take 1 tablet by mouth daily. 10/12/20   Genia Del, MD    Allergies    Patient has no known allergies.  Review of Systems   Review of Systems  Constitutional:  Negative for fever.  HENT: Negative for ear pain.   Eyes: Negative for pain.  Respiratory: Negative for cough.   Cardiovascular: Negative for chest pain.  Gastrointestinal: Negative for abdominal pain.  Genitourinary: Negative for flank pain.  Musculoskeletal: Negative for back pain.  Skin: Negative for rash.  Neurological: Negative for headaches.    Physical Exam Updated Vital Signs BP (!) 107/51   Pulse 85   Temp 98.9 F (37.2 C) (Oral)   Resp 16   Ht 5\' 1"  (1.549 m)   Wt 49.9 kg   LMP  (LMP Unknown)   SpO2 100%   BMI 20.78 kg/m   Physical Exam Constitutional:      General: She is not in acute distress.    Appearance: Normal appearance.  HENT:     Head: Normocephalic.     Nose: Nose normal.  Eyes:     Extraocular Movements: Extraocular movements intact.  Cardiovascular:     Rate and Rhythm: Normal rate.  Pulmonary:     Effort: Pulmonary effort is normal.  Abdominal:     General: Abdomen is flat.  There is no distension.     Tenderness: There is no abdominal tenderness.     Comments: Questionable left-sided CVA tenderness.  Musculoskeletal:        General: Normal range of motion.     Cervical back: Normal range of motion.  Neurological:     General: No focal deficit present.     Mental Status: She is alert. Mental status is at baseline.     ED Results / Procedures / Treatments   Labs (all labs ordered are listed, but only abnormal results are displayed) Labs Reviewed  PREGNANCY, URINE - Abnormal; Notable for the following components:      Result Value   Preg Test, Ur POSITIVE (*)    All other components within normal limits  BASIC METABOLIC PANEL - Abnormal; Notable for the following components:   Potassium 3.2 (*)    CO2 20 (*)    Glucose, Bld 168 (*)    BUN 27 (*)    Anion gap 16 (*)    All other components within normal limits  CBC WITH DIFFERENTIAL/PLATELET - Abnormal; Notable for the following components:   WBC 19.0 (*)    MCHC 36.6 (*)     Neutro Abs 17.4 (*)    Abs Immature Granulocytes 0.09 (*)    All other components within normal limits  URINALYSIS, ROUTINE W REFLEX MICROSCOPIC - Abnormal; Notable for the following components:   Color, Urine PINK (*)    APPearance TURBID (*)    Hgb urine dipstick SMALL (*)    Ketones, ur 80 (*)    Protein, ur 100 (*)    All other components within normal limits  LACTIC ACID, PLASMA - Abnormal; Notable for the following components:   Lactic Acid, Venous 2.7 (*)    All other components within normal limits  URINALYSIS, MICROSCOPIC (REFLEX) - Abnormal; Notable for the following components:   Bacteria, UA MANY (*)    All other components within normal limits  CULTURE, BLOOD (ROUTINE X 2)  CULTURE, BLOOD (ROUTINE X 2)  LACTIC ACID, PLASMA  HCG, QUANTITATIVE, PREGNANCY    EKG None  Radiology No results found.  Procedures Procedures (including critical care time)  Medications Ordered in ED Medications  potassium chloride (KLOR-CON) packet 40 mEq (40 mEq Oral Given 11/27/20 1900)  cefTRIAXone (ROCEPHIN) 1 g in sodium chloride 0.9 % 100 mL IVPB (has no administration in time range)  sodium chloride 0.9 % bolus 1,000 mL (0 mLs Intravenous Stopped 11/27/20 1839)  promethazine (PHENERGAN) injection 25 mg (25 mg Intravenous Given 11/27/20 1736)  sodium chloride 0.9 % bolus 1,000 mL (1,000 mLs Intravenous New Bag/Given 11/27/20 1940)    ED Course  I have reviewed the triage vital signs and the nursing notes.  Pertinent labs & imaging results that were available during my care of the patient were reviewed by me and considered in my medical decision making (see chart for details).    MDM Rules/Calculators/A&P                          Labs show a white count of 19.  Reviewed labs from yesterday show white count of 17 appears to be trending upwards.  Patient denies any fevers.  Perhaps a white count is due to vomiting.  Urinalysis sent with many bacteria some squamous cells however  present possible contaminant.  Lactic acid sent and returned elevated 2.7.  This point concern for possible sepsis and possible urinary tract  infection in setting of pregnancy malaise and vomiting.  Patient given IV fluid hydration given Rocephin after blood cultures are sent.  Case discussed with OB team, they will bring the patient in for observation. Final Clinical Impression(s) / ED Diagnoses Final diagnoses:  Pyelonephritis    Rx / DC Orders ED Discharge Orders    None       Cheryll Cockayne, MD 11/27/20 1950

## 2020-11-28 ENCOUNTER — Observation Stay (HOSPITAL_BASED_OUTPATIENT_CLINIC_OR_DEPARTMENT_OTHER): Payer: 59

## 2020-11-28 ENCOUNTER — Encounter (HOSPITAL_COMMUNITY): Payer: Self-pay | Admitting: Obstetrics and Gynecology

## 2020-11-28 DIAGNOSIS — Z3A00 Weeks of gestation of pregnancy not specified: Secondary | ICD-10-CM | POA: Diagnosis not present

## 2020-11-28 DIAGNOSIS — N12 Tubulo-interstitial nephritis, not specified as acute or chronic: Secondary | ICD-10-CM | POA: Diagnosis not present

## 2020-11-28 DIAGNOSIS — R110 Nausea: Secondary | ICD-10-CM

## 2020-11-28 DIAGNOSIS — Z3A01 Less than 8 weeks gestation of pregnancy: Secondary | ICD-10-CM

## 2020-11-28 DIAGNOSIS — O219 Vomiting of pregnancy, unspecified: Secondary | ICD-10-CM | POA: Diagnosis not present

## 2020-11-28 DIAGNOSIS — O3680X0 Pregnancy with inconclusive fetal viability, not applicable or unspecified: Secondary | ICD-10-CM | POA: Diagnosis not present

## 2020-11-28 DIAGNOSIS — O2300 Infections of kidney in pregnancy, unspecified trimester: Secondary | ICD-10-CM | POA: Diagnosis present

## 2020-11-28 DIAGNOSIS — Z20822 Contact with and (suspected) exposure to covid-19: Secondary | ICD-10-CM | POA: Diagnosis not present

## 2020-11-28 DIAGNOSIS — O26891 Other specified pregnancy related conditions, first trimester: Secondary | ICD-10-CM | POA: Diagnosis not present

## 2020-11-28 DIAGNOSIS — E860 Dehydration: Secondary | ICD-10-CM | POA: Diagnosis not present

## 2020-11-28 DIAGNOSIS — O2301 Infections of kidney in pregnancy, first trimester: Secondary | ICD-10-CM | POA: Diagnosis not present

## 2020-11-28 LAB — CBC
HCT: 33.4 % — ABNORMAL LOW (ref 36.0–46.0)
Hemoglobin: 11.6 g/dL — ABNORMAL LOW (ref 12.0–15.0)
MCH: 30.4 pg (ref 26.0–34.0)
MCHC: 34.7 g/dL (ref 30.0–36.0)
MCV: 87.4 fL (ref 80.0–100.0)
Platelets: 265 10*3/uL (ref 150–400)
RBC: 3.82 MIL/uL — ABNORMAL LOW (ref 3.87–5.11)
RDW: 12.3 % (ref 11.5–15.5)
WBC: 15.8 10*3/uL — ABNORMAL HIGH (ref 4.0–10.5)
nRBC: 0 % (ref 0.0–0.2)

## 2020-11-28 MED ORDER — PRENATAL MULTIVITAMIN CH
1.0000 | ORAL_TABLET | Freq: Every day | ORAL | Status: DC
  Administered 2020-11-28: 1 via ORAL
  Filled 2020-11-28: qty 1

## 2020-11-28 MED ORDER — SODIUM CHLORIDE 0.9 % IV SOLN: INTRAVENOUS | Status: DC

## 2020-11-28 MED ORDER — PROMETHAZINE HCL 25 MG/ML IJ SOLN: 25.0000 mg | Freq: Four times a day (QID) | INTRAMUSCULAR | Status: DC | PRN

## 2020-11-28 MED ORDER — DOCUSATE SODIUM 100 MG PO CAPS
100.0000 mg | ORAL_CAPSULE | Freq: Every day | ORAL | Status: DC
  Administered 2020-11-28: 100 mg via ORAL
  Filled 2020-11-28: qty 1

## 2020-11-28 MED ORDER — ZOLPIDEM TARTRATE 5 MG PO TABS: 5.0000 mg | ORAL_TABLET | Freq: Every evening | ORAL | Status: DC | PRN

## 2020-11-28 MED ORDER — CALCIUM CARBONATE ANTACID 500 MG PO CHEW: 2.0000 | CHEWABLE_TABLET | ORAL | Status: DC | PRN

## 2020-11-28 MED ORDER — SODIUM CHLORIDE 0.9 % IV SOLN
2.0000 g | Freq: Once | INTRAVENOUS | Status: AC
  Administered 2020-11-28: 2 g via INTRAVENOUS
  Filled 2020-11-28 (×2): qty 20

## 2020-11-28 MED ORDER — ACETAMINOPHEN 325 MG PO TABS: 650.0000 mg | ORAL_TABLET | ORAL | Status: DC | PRN

## 2020-11-28 NOTE — H&P (Signed)
Obstetric History and Physical  Mallory Myers is a 25 y.o. G1P0000 with IUP at Unknown presenting for 2 days of nausea/vomiting and being unable to keep anything down. Generally not feeling well, has been having chills after vomiting. Took a pregnancy test at home a few days ago, was on OCPs. Denies leaking or bleeding   Patient Active Problem List   Diagnosis Date Noted  . Pyelonephritis affecting pregnancy 11/28/2020  . Pyelonephritis 11/27/2020  . Nexplanon in place 12/09/2015  . Yeast vaginitis 03/25/2015  . NECK PAIN, ACUTE 05/12/2011  . TORTICOLLIS 05/12/2011  . RIB PAIN 05/04/2011   Medical History:  Past Medical History:  Diagnosis Date  . Seasonal allergies     Past Surgical History:  Procedure Laterality Date  . TYMPANOSTOMY TUBE PLACEMENT  1999    OB History  Gravida Para Term Preterm AB Living  1 0 0 0 0 0  SAB IAB Ectopic Multiple Live Births  0 0 0 0      # Outcome Date GA Lbr Len/2nd Weight Sex Delivery Anes PTL Lv  1 Current             Social History   Socioeconomic History  . Marital status: Single    Spouse name: Not on file  . Number of children: Not on file  . Years of education: Not on file  . Highest education level: Not on file  Occupational History  . Not on file  Tobacco Use  . Smoking status: Never Smoker  . Smokeless tobacco: Never Used  Vaping Use  . Vaping Use: Never used  Substance and Sexual Activity  . Alcohol use: No    Alcohol/week: 0.0 standard drinks  . Drug use: Not Currently    Types: Marijuana  . Sexual activity: Yes    Partners: Male    Birth control/protection: None, Pill    Comment: INTERCOURSE AGE 30, SEXUAL PARTNERS LESS THAN 5,   Other Topics Concern  . Not on file  Social History Narrative  . Not on file   Social Determinants of Health   Financial Resource Strain: Not on file  Food Insecurity: Not on file  Transportation Needs: Not on file  Physical Activity: Not on file  Stress: Not on file   Social Connections: Not on file    Family History  Problem Relation Age of Onset  . Depression Mother   . Anxiety disorder Mother   . Hypertension Mother   . Asthma Mother   . Alcohol abuse Other     Medications Prior to Admission  Medication Sig Dispense Refill Last Dose  . norethindrone-ethinyl estradiol (LOESTRIN FE) 1-20 MG-MCG tablet Take 1 tablet by mouth daily. 84 tablet 4     No Known Allergies  Review of Systems: Negative except for what is mentioned in HPI.  Physical Exam: BP (!) 108/54 (BP Location: Right Arm)   Pulse 68   Temp 98.7 F (37.1 C) (Oral)   Resp 18   Ht 5\' 1"  (1.549 m)   Wt 49.9 kg   LMP  (LMP Unknown)   SpO2 100%   BMI 20.78 kg/m  CONSTITUTIONAL: Well-developed, well-nourished female in no acute distress.  HENT:  Normocephalic, atraumatic, External right and left ear normal. Oropharynx is clear and moist EYES: Conjunctivae and EOM are normal. Pupils are equal, round, and reactive to light. No scleral icterus.  NECK: Normal range of motion, supple, no masses SKIN: Skin is warm and dry. No rash noted. Not diaphoretic.  No erythema. No pallor. Patient feels warm to touch NEUROLOGIC: Alert and oriented to person, place, and time. Normal reflexes, muscle tone coordination. No cranial nerve deficit noted. PSYCHIATRIC: Normal mood and affect. Normal behavior. Normal judgment and thought content. CARDIOVASCULAR: Normal heart rate noted, regular rhythm RESPIRATORY: Effort and breath sounds normal, no problems with respiration noted ABDOMEN: Soft, nontender, nondistended MUSCULOSKELETAL: Normal range of motion. No edema and no tenderness. 2+ distal pulses.    Pertinent Labs/Studies:   Results for orders placed or performed during the hospital encounter of 11/27/20 (from the past 24 hour(s))  Pregnancy, urine     Status: Abnormal   Collection Time: 11/27/20  1:51 PM  Result Value Ref Range   Preg Test, Ur POSITIVE (A) NEGATIVE  Urinalysis, Routine w  reflex microscopic Urine, Clean Catch     Status: Abnormal   Collection Time: 11/27/20  1:51 PM  Result Value Ref Range   Color, Urine PINK (A) YELLOW   APPearance TURBID (A) CLEAR   Specific Gravity, Urine 1.030 1.005 - 1.030   pH 6.0 5.0 - 8.0   Glucose, UA NEGATIVE NEGATIVE mg/dL   Hgb urine dipstick SMALL (A) NEGATIVE   Bilirubin Urine NEGATIVE NEGATIVE   Ketones, ur 80 (A) NEGATIVE mg/dL   Protein, ur 295 (A) NEGATIVE mg/dL   Nitrite NEGATIVE NEGATIVE   Leukocytes,Ua NEGATIVE NEGATIVE  Urinalysis, Microscopic (reflex)     Status: Abnormal   Collection Time: 11/27/20  1:51 PM  Result Value Ref Range   RBC / HPF 6-10 0 - 5 RBC/hpf   WBC, UA 0-5 0 - 5 WBC/hpf   Bacteria, UA MANY (A) NONE SEEN   Squamous Epithelial / LPF 11-20 0 - 5   Amorphous Crystal PRESENT   Basic metabolic panel     Status: Abnormal   Collection Time: 11/27/20  5:30 PM  Result Value Ref Range   Sodium 138 135 - 145 mmol/L   Potassium 3.2 (L) 3.5 - 5.1 mmol/L   Chloride 102 98 - 111 mmol/L   CO2 20 (L) 22 - 32 mmol/L   Glucose, Bld 168 (H) 70 - 99 mg/dL   BUN 27 (H) 6 - 20 mg/dL   Creatinine, Ser 2.84 0.44 - 1.00 mg/dL   Calcium 13.2 8.9 - 44.0 mg/dL   GFR, Estimated >10 >27 mL/min   Anion gap 16 (H) 5 - 15  CBC with Differential     Status: Abnormal   Collection Time: 11/27/20  5:30 PM  Result Value Ref Range   WBC 19.0 (H) 4.0 - 10.5 K/uL   RBC 4.60 3.87 - 5.11 MIL/uL   Hemoglobin 14.4 12.0 - 15.0 g/dL   HCT 25.3 66.4 - 40.3 %   MCV 85.4 80.0 - 100.0 fL   MCH 31.3 26.0 - 34.0 pg   MCHC 36.6 (H) 30.0 - 36.0 g/dL   RDW 47.4 25.9 - 56.3 %   Platelets 370 150 - 400 K/uL   nRBC 0.0 0.0 - 0.2 %   Neutrophils Relative % 91 %   Neutro Abs 17.4 (H) 1.7 - 7.7 K/uL   Lymphocytes Relative 5 %   Lymphs Abs 1.0 0.7 - 4.0 K/uL   Monocytes Relative 3 %   Monocytes Absolute 0.5 0.1 - 1.0 K/uL   Eosinophils Relative 0 %   Eosinophils Absolute 0.0 0.0 - 0.5 K/uL   Basophils Relative 0 %   Basophils  Absolute 0.0 0.0 - 0.1 K/uL   Immature Granulocytes 1 %  Abs Immature Granulocytes 0.09 (H) 0.00 - 0.07 K/uL  hCG, quantitative, pregnancy     Status: Abnormal   Collection Time: 11/27/20  5:30 PM  Result Value Ref Range   hCG, Beta Chain, Quant, S 57,615 (H) <5 mIU/mL  Lactic acid, plasma     Status: Abnormal   Collection Time: 11/27/20  6:49 PM  Result Value Ref Range   Lactic Acid, Venous 2.7 (HH) 0.5 - 1.9 mmol/L  Resp Panel by RT-PCR (Flu A&B, Covid) Nasopharyngeal Swab     Status: None   Collection Time: 11/27/20  8:17 PM   Specimen: Nasopharyngeal Swab; Nasopharyngeal(NP) swabs in vial transport medium  Result Value Ref Range   SARS Coronavirus 2 by RT PCR NEGATIVE NEGATIVE   Influenza A by PCR NEGATIVE NEGATIVE   Influenza B by PCR NEGATIVE NEGATIVE  Lactic acid, plasma     Status: None   Collection Time: 11/27/20  8:19 PM  Result Value Ref Range   Lactic Acid, Venous 1.4 0.5 - 1.9 mmol/L    Assessment : Mallory Myers is a 25 y.o. G1P0000 at Unknown being admitted for presumed pyelonephritsi in the setting of white count and possible CVA tenderness. I did not appreciate any CVA tenderness although patient does feel warm to touch. Has received 1 dose rocephin and lactic acidosis was resolved. Blood culture and urine culture pending. Suspect some of her WBC may be attributed to vomiting, which she is reporting feeling better.   Plan for [place] today to assess dates Nausea meds prn CBC ordered this am Urine culture, blood culture pending  Pt verbalizes understanding of the plan and is in agreement   K. Arvilla Meres, MD, [facility] (Faculty Practice)  11/28/2020, 2:05 AM

## 2020-11-29 DIAGNOSIS — E860 Dehydration: Secondary | ICD-10-CM | POA: Diagnosis not present

## 2020-11-29 DIAGNOSIS — R110 Nausea: Secondary | ICD-10-CM | POA: Diagnosis not present

## 2020-11-29 DIAGNOSIS — N12 Tubulo-interstitial nephritis, not specified as acute or chronic: Secondary | ICD-10-CM | POA: Diagnosis not present

## 2020-11-29 DIAGNOSIS — O2301 Infections of kidney in pregnancy, first trimester: Secondary | ICD-10-CM | POA: Diagnosis not present

## 2020-11-29 DIAGNOSIS — Z3A00 Weeks of gestation of pregnancy not specified: Secondary | ICD-10-CM | POA: Diagnosis not present

## 2020-11-29 DIAGNOSIS — Z3A01 Less than 8 weeks gestation of pregnancy: Secondary | ICD-10-CM | POA: Diagnosis not present

## 2020-11-29 DIAGNOSIS — O26891 Other specified pregnancy related conditions, first trimester: Secondary | ICD-10-CM | POA: Diagnosis not present

## 2020-11-29 DIAGNOSIS — Z20822 Contact with and (suspected) exposure to covid-19: Secondary | ICD-10-CM | POA: Diagnosis not present

## 2020-11-29 LAB — CULTURE, OB URINE: Culture: NO GROWTH

## 2020-11-29 MED ORDER — DOXYLAMINE-PYRIDOXINE 10-10 MG PO TBEC: 2.0000 | DELAYED_RELEASE_TABLET | Freq: Every day | ORAL | 5 refills | Status: DC

## 2020-11-29 NOTE — Discharge Instructions (Signed)

## 2020-11-29 NOTE — Discharge Summary (Signed)
Antenatal Physician Discharge Summary  Patient ID: Mallory Myers MRN: [id number] DOB/AGE: [birth year] 25 y.o.  Admit date: 11/27/2020 Discharge date: 11/29/2020  Admission Diagnoses: presumed pyelonephritis  Discharge Diagnoses:  Active Problems:   Dehydration   Prenatal Procedures: none  Consults: none  Hospital Course:  This is a 25 y.o. G1P0000 with IUP at [redacted]w[redacted]d admitted for presumed pyelonephritis with nausea/vomiting, back pain, dirty urine and elevated white count. On arrival, patient looked well but had been nauseous and vomiting x 2 days. She received 2 doses of rocephin and eventually had negative urine culture, I feel she is not pyelonephritis and symptoms were due to nausea/vomiting. She is feeling better today with minimal nausea. Cleared for discharge home, US showing intrauterine pregnancy of [redacted]w[redacted]d. Rx given for diclegis, to f/u as outpatient.  Discharge Exam: Temp:  [98.1 F (36.7 C)-98.5 F (36.9 C)] 98.4 F (36.9 C) (01/02 0759) Pulse Rate:  [69-87] 87 (01/02 0759) Resp:  [17-18] 18 (01/02 0759) BP: (102-114)/(58-76) 113/63 (01/02 0759) SpO2:  [100 %] 100 % (01/02 0759) Physical Examination: CONSTITUTIONAL: Well-developed, well-nourished female in no acute distress.  HENT:  Normocephalic, atraumatic, External right and left ear normal. Oropharynx is clear and moist EYES: Conjunctivae and EOM are normal. Pupils are equal, round, and reactive to light. No scleral icterus.  NECK: Normal range of motion, supple, no masses SKIN: Skin is warm and dry. No rash noted. Not diaphoretic. No erythema. No pallor. NEUROLGIC: Alert and oriented to person, place, and time. Normal reflexes, muscle tone coordination. No cranial nerve deficit noted. PSYCHIATRIC: Normal mood and affect. Normal behavior. Normal judgment and thought content. CARDIOVASCULAR: Normal heart rate noted RESPIRATORY: Effort normal, no problems with respiration noted MUSCULOSKELETAL: Normal range of motion.  No edema and no tenderness. 2+ distal pulses. ABDOMEN: Soft, nontender, nondistended CERVIX:   deferred  Significant Diagnostic Studies:  Results for orders placed or performed during the hospital encounter of 11/27/20 (from the past 168 hour(s))  Pregnancy, urine   Collection Time: 11/27/20  1:51 PM  Result Value Ref Range   Preg Test, Ur POSITIVE (A) NEGATIVE  Urinalysis, Routine w reflex microscopic Urine, Clean Catch   Collection Time: 11/27/20  1:51 PM  Result Value Ref Range   Color, Urine PINK (A) YELLOW   APPearance TURBID (A) CLEAR   Specific Gravity, Urine 1.030 1.005 - 1.030   pH 6.0 5.0 - 8.0   Glucose, UA NEGATIVE NEGATIVE mg/dL   Hgb urine dipstick SMALL (A) NEGATIVE   Bilirubin Urine NEGATIVE NEGATIVE   Ketones, ur 80 (A) NEGATIVE mg/dL   Protein, ur 242 (A) NEGATIVE mg/dL   Nitrite NEGATIVE NEGATIVE   Leukocytes,Ua NEGATIVE NEGATIVE  Urinalysis, Microscopic (reflex)   Collection Time: 11/27/20  1:51 PM  Result Value Ref Range   RBC / HPF 6-10 0 - 5 RBC/hpf   WBC, UA 0-5 0 - 5 WBC/hpf   Bacteria, UA MANY (A) NONE SEEN   Squamous Epithelial / LPF 11-20 0 - 5   Amorphous Crystal PRESENT   Basic metabolic panel   Collection Time: 11/27/20  5:30 PM  Result Value Ref Range   Sodium 138 135 - 145 mmol/L   Potassium 3.2 (L) 3.5 - 5.1 mmol/L   Chloride 102 98 - 111 mmol/L   CO2 20 (L) 22 - 32 mmol/L   Glucose, Bld 168 (H) 70 - 99 mg/dL   BUN 27 (H) 6 - 20 mg/dL   Creatinine, Ser 6.83 0.44 - 1.00 mg/dL   Calcium  10.1 8.9 - 10.3 mg/dL   GFR, Estimated >10 >62 mL/min   Anion gap 16 (H) 5 - 15  CBC with Differential   Collection Time: 11/27/20  5:30 PM  Result Value Ref Range   WBC 19.0 (H) 4.0 - 10.5 K/uL   RBC 4.60 3.87 - 5.11 MIL/uL   Hemoglobin 14.4 12.0 - 15.0 g/dL   HCT 69.4 85.4 - 62.7 %   MCV 85.4 80.0 - 100.0 fL   MCH 31.3 26.0 - 34.0 pg   MCHC 36.6 (H) 30.0 - 36.0 g/dL   RDW 03.5 00.9 - 38.1 %   Platelets 370 150 - 400 K/uL   nRBC 0.0 0.0 - 0.2 %    Neutrophils Relative % 91 %   Neutro Abs 17.4 (H) 1.7 - 7.7 K/uL   Lymphocytes Relative 5 %   Lymphs Abs 1.0 0.7 - 4.0 K/uL   Monocytes Relative 3 %   Monocytes Absolute 0.5 0.1 - 1.0 K/uL   Eosinophils Relative 0 %   Eosinophils Absolute 0.0 0.0 - 0.5 K/uL   Basophils Relative 0 %   Basophils Absolute 0.0 0.0 - 0.1 K/uL   Immature Granulocytes 1 %   Abs Immature Granulocytes 0.09 (H) 0.00 - 0.07 K/uL  hCG, quantitative, pregnancy   Collection Time: 11/27/20  5:30 PM  Result Value Ref Range   hCG, Beta Chain, Quant, S 57,615 (H) <5 mIU/mL  Culture, blood (routine x 2)   Collection Time: 11/27/20  6:49 PM   Specimen: BLOOD  Result Value Ref Range   Specimen Description      BLOOD LEFT ANTECUBITAL Performed at [facility], [address], [place], [place] [address]    Special Requests      BOTTLES DRAWN AEROBIC AND ANAEROBIC Blood Culture adequate volume Performed at [facility], [address] [place], [place] [address]    Culture      NO GROWTH < 12 HOURS Performed at [facility], [address] [place], [place] [address]    Report Status PENDING   Lactic acid, plasma   Collection Time: 11/27/20  6:49 PM  Result Value Ref Range   Lactic Acid, Venous 2.7 (HH) 0.5 - 1.9 mmol/L  Culture, blood (routine x 2)   Collection Time: 11/27/20  7:15 PM   Specimen: BLOOD LEFT ARM  Result Value Ref Range   Specimen Description      BLOOD LEFT ARM Performed at [facility], [address] [place], [place] [address]    Special Requests      BOTTLES DRAWN AEROBIC AND ANAEROBIC Blood Culture adequate volume Performed at [facility], [address] [place] [address] [place], [place] [address]    Culture      NO GROWTH < 12 HOURS Performed at [facility], [address] [place], [place] [address]    Report Status PENDING   Resp Panel by RT-PCR (Flu A&B, Covid) Nasopharyngeal Swab   Collection Time: 11/27/20  8:17 PM   Specimen:  Nasopharyngeal Swab; Nasopharyngeal(NP) swabs in vial transport medium  Result Value Ref Range   SARS Coronavirus 2 by RT PCR NEGATIVE NEGATIVE   Influenza A by PCR NEGATIVE NEGATIVE   Influenza B by PCR NEGATIVE NEGATIVE  Lactic acid, plasma   Collection Time: 11/27/20  8:19 PM  Result Value Ref Range   Lactic Acid, Venous 1.4 0.5 - 1.9 mmol/L  CBC   Collection Time: 11/28/20  7:23 AM  Result Value Ref Range   WBC 15.8 (H) 4.0 - 10.5 K/uL   RBC 3.82 (L) 3.87 - 5.11 MIL/uL   Hemoglobin 11.6 (L) 12.0 - 15.0 g/dL   HCT 02.5 (L) 85.2 - 77.8 %   MCV 87.4 80.0 - 100.0 fL   MCH 30.4 26.0 - 34.0 pg   MCHC 34.7 30.0 - 36.0 g/dL   RDW 24.2 35.3 - 61.4 %   Platelets 265 150 - 400 K/uL   nRBC 0.0 0.0 - 0.2 %  OB Urine Culture   Collection Time: 11/28/20 12:04 PM   Specimen: Urine, Random  Result Value Ref Range   Specimen Description URINE, RANDOM    Special Requests NONE    Culture      NO GROWTH NO GROUP B STREP (S.AGALACTIAE) ISOLATED Performed at [facility], [address] [place], [place] [address]    Report Status 11/29/2020 FINAL   Results for orders placed or performed during the hospital encounter of 11/27/20 (from the past 168 hour(s))  Lipase, blood   Collection Time: 11/27/20  9:40 AM  Result Value Ref Range   Lipase 20 11 - 51 U/L  Comprehensive metabolic panel   Collection Time: 11/27/20  9:40 AM  Result Value Ref Range   Sodium 137 135 - 145 mmol/L   Potassium 3.9 3.5 - 5.1 mmol/L   Chloride 102 98 - 111 mmol/L   CO2 19 (L) 22 - 32 mmol/L   Glucose, Bld 170 (H) 70 - 99 mg/dL   BUN 22 (H) 6 - 20 mg/dL   Creatinine, Ser 0.08 0.44 - 1.00 mg/dL   Calcium 9.9 8.9 - 67.6 mg/dL   Total Protein 8.4 (H) 6.5 - 8.1 g/dL   Albumin 5.2 (H) 3.5 - 5.0 g/dL   AST 23 15 - 41 U/L   ALT 21 0 - 44 U/L   Alkaline Phosphatase 43 38 - 126 U/L   Total Bilirubin 1.4 (H) 0.3 - 1.2 mg/dL   GFR, Estimated >19 >50 mL/min   Anion gap 16 (H) 5 - 15  CBC   Collection Time:  11/27/20  9:40 AM  Result Value Ref Range   WBC 17.2 (H) 4.0 - 10.5 K/uL   RBC 4.72 3.87 - 5.11 MIL/uL   Hemoglobin 14.7 12.0 - 15.0 g/dL   HCT 93.2 67.1 - 24.5 %   MCV 86.2 80.0 - 100.0 fL   MCH 31.1 26.0 - 34.0 pg   MCHC 36.1 (H) 30.0 - 36.0 g/dL   RDW 80.9 98.3 - 38.2 %   Platelets 388 150 - 400 K/uL   nRBC 0.0 0.0 - 0.2 %  I-Stat beta hCG blood, ED   Collection Time: 11/27/20  9:47 AM  Result Value Ref Range   I-stat hCG, quantitative >2,000.0 (H) <5 mIU/mL   Comment 3            Discharge Condition: Stable  Disposition: Discharge disposition: 01-Home or Self Care        Discharge Instructions     Discharge activity:  No Restrictions   Complete by: As directed       Allergies as of 11/29/2020   No Known Allergies      Medication List     STOP taking these medications    norethindrone-ethinyl estradiol 1-20 MG-MCG tablet Commonly known as: LOESTRIN FE       TAKE these medications    Doxylamine-Pyridoxine 10-10 MG Tbec Commonly known as: Diclegis Take  2 tablets by mouth at bedtime. If symptoms persist, add one tablet in the morning and one in the afternoon        Follow-up Information     [facility] Follow up.   Contact information: [place]  [address] [place] [address] [phone]                Signed: Feliz Beam, MD, [facility] [place] (Faculty Practice)  11/29/2020, 9:10 AM

## 2020-12-02 LAB — CULTURE, BLOOD (ROUTINE X 2)
Culture: NO GROWTH
Culture: NO GROWTH
Special Requests: ADEQUATE
Special Requests: ADEQUATE

## 2021-08-31 ENCOUNTER — Encounter: Payer: Self-pay | Admitting: Nurse Practitioner

## 2021-08-31 ENCOUNTER — Other Ambulatory Visit: Payer: Self-pay

## 2021-08-31 ENCOUNTER — Ambulatory Visit: Payer: 59 | Admitting: Nurse Practitioner

## 2021-08-31 VITALS — BP 114/66

## 2021-08-31 DIAGNOSIS — Z30013 Encounter for initial prescription of injectable contraceptive: Secondary | ICD-10-CM | POA: Diagnosis not present

## 2021-08-31 DIAGNOSIS — Z3009 Encounter for other general counseling and advice on contraception: Secondary | ICD-10-CM | POA: Diagnosis not present

## 2021-08-31 MED ORDER — MEDROXYPROGESTERONE ACETATE 150 MG/ML IM SUSP
150.0000 mg | Freq: Once | INTRAMUSCULAR | Status: AC
  Administered 2021-08-31: 150 mg via INTRAMUSCULAR

## 2021-08-31 NOTE — Progress Notes (Signed)
   Acute Office Visit  Subjective:    Patient ID: Mallory Myers, female    DOB: 04-30-1996, 25 y.o.   MRN: [id number]   HPI 25 y.o. presents today to discuss contraception. Had Nexplanon in the past and did fine but did not like the thought of the device in her arm. Currently using condoms consistently. She is interested in a non-hormonal option but also considering Depo Provera. She would like Depo today and will decide if she wants to continue or switch to Paragard. Sexually active. LMP 08/29/2021.    Review of Systems  Constitutional: Negative.   Genitourinary: Negative.       Objective:    Physical Exam Constitutional:      Appearance: Normal appearance.  GU: Not indicated  BP 114/66   LMP 08/29/2021 (Exact Date)   Breastfeeding Unknown  Wt Readings from Last 3 Encounters:  11/27/20 110 lb (49.9 kg)  10/12/20 104 lb (47.2 kg)  10/08/19 102 lb (46.3 kg)        Assessment & Plan:   Problem List Items Addressed This Visit   None Visit Diagnoses     Encounter for other general counseling or advice on contraception    -  Primary   Encounter for initial prescription of injectable contraceptive       Relevant Medications   medroxyPROGESTERone (DEPO-PROVERA) injection 150 mg (Completed)      Plan: Contraceptive options were reviewed, including hormonal methods, both combination (pill, patch, vaginal ring) and progesterone-only (pill, Depo Provera and Nexplanon), intrauterine devices (Mirena, [place], [place], and [place]), barrier methods (condoms, diaphragm). The mechanisms, risks, benefits and side effects of all methods were discussed. She would like Depo Provera today and will consider Paragard.   Initial dose of Depo Provera administered. She was provided with time frame for next dose if interested. All questions answered.     Olivia Mackie DNP, 9:27 AM 08/31/2021

## 2021-09-10 ENCOUNTER — Telehealth: Payer: Self-pay | Admitting: *Deleted

## 2021-09-10 NOTE — Telephone Encounter (Signed)
She can take Ibuprofen 800 mg three times a day x 5 days to help slow bleeding and with cramping if she is also experiencing this as well. If heavy bleeding continues after this she needs to be seen. Reassure her that irregular bleeding/spotting is normal first 3-6 months on any new contraception.

## 2021-09-10 NOTE — Telephone Encounter (Signed)
Attempted to reach patient by telephone x3, number would not go through.   MyChart message to patient with recommendations.

## 2021-09-10 NOTE — Telephone Encounter (Signed)
Spoke with patient.  Seen in office on 08/31/21, started Depo Provera. LMP approximately 08/28/21. Has been bleeding with clots since receiving depo. Asking if normal?   Reports changing super tampon q3 hours with some quarter size clots and mild cramping. Denies SOB, lightheadedness, weakness, dizziness or fatigue.   Advised patient can take at least 3 months for menses to adjust to new contraceptive. Continue to monitor menses. If bleeding becomes heavy where you are changing saturated pad/tampon q1-2 hours or any new symptoms develop, return call to office to be seen. ER for evaluation if after hours/ weekend. Advised I will update Tiffany, NP and return call if any additional recommendations. Patient agreeable.   Routing to [place], NP for final review.

## 2021-09-13 NOTE — Telephone Encounter (Signed)
Attempted to contact patient. Call does not go through, attempted  x2.

## 2021-09-16 NOTE — Telephone Encounter (Signed)
Attempted patients number x2. Number does not go through.  MyChart message has not be read.  Reviewed dpr, call placed to Mom, Inetta Fermo. Left message to have patient call Noreene Larsson, RN at [place], [phone].

## 2021-09-22 NOTE — Telephone Encounter (Signed)
No return call from patient.  MyChart message not read.  Routing to Brendolyn Patty, NP FYI.   Ok to close encounter?

## 2021-09-23 NOTE — Telephone Encounter (Signed)
Encounter closed

## 2021-09-23 NOTE — Telephone Encounter (Signed)
Yes, OK to close

## 2021-10-29 ENCOUNTER — Ambulatory Visit: Payer: 59 | Admitting: Obstetrics & Gynecology

## 2021-11-01 IMAGING — US US OB < 14 WEEKS - US OB TV
1 series · 15 of 28 positions shown · non-contrast
Comparison: None.

CLINICAL DATA: 24-year-old female reportedly 6 weeks 6 days
pregnant with a quantitative beta HCG of [DATE]. She is experiencing
nausea and vomiting.

EXAM:
OBSTETRIC <14 WK US AND TRANSVAGINAL OB US
TECHNIQUE: Both transabdominal and transvaginal ultrasound examinations were
performed for complete evaluation of the gestation as well as the
maternal uterus, adnexal regions, and pelvic cul-de-sac.
Transvaginal technique was performed to assess early pregnancy.

[Series 1: us ob < 14 weeks - us ob tv · 47 acquisitions, 15 frames shown]
[im 1/47]
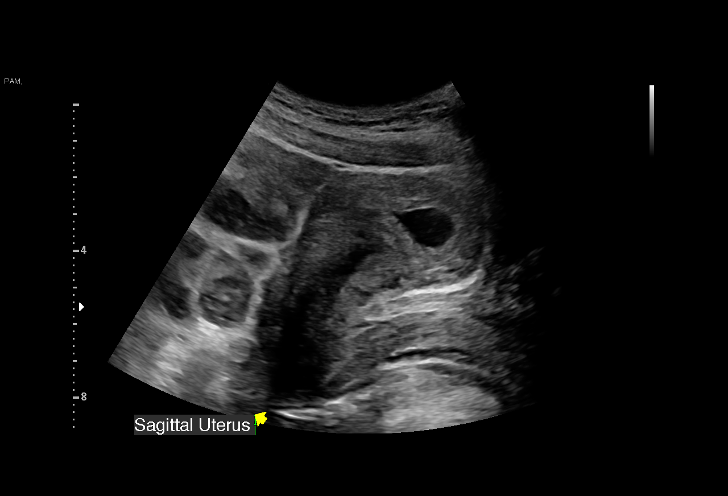
[im 4/47]
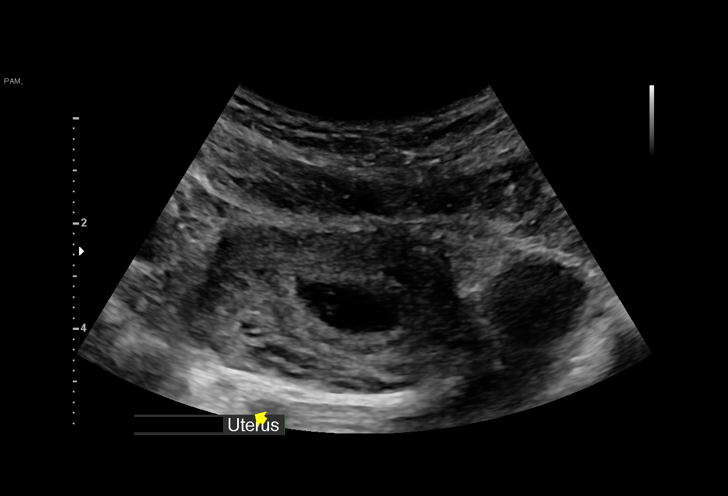
[im 7/47]
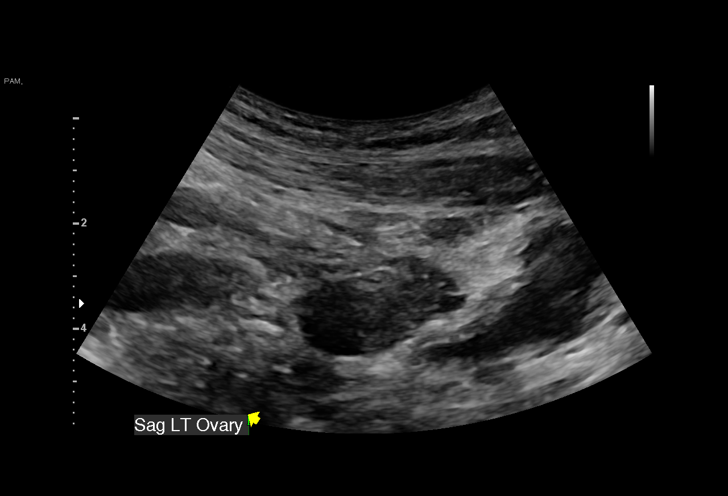
[im 11/47]
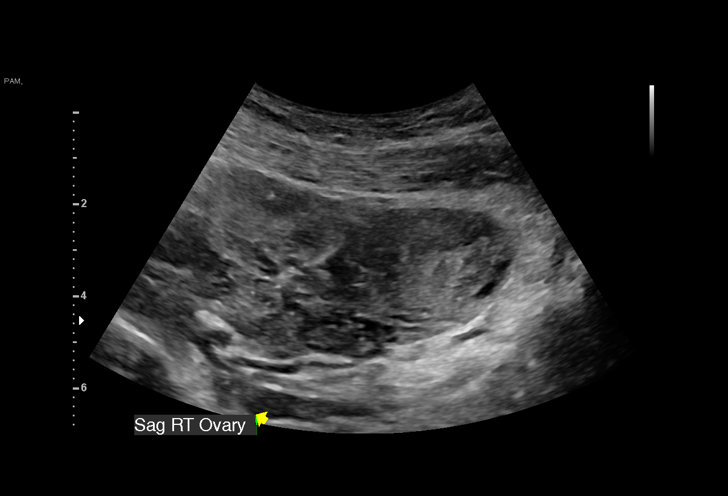
[im 14/47]
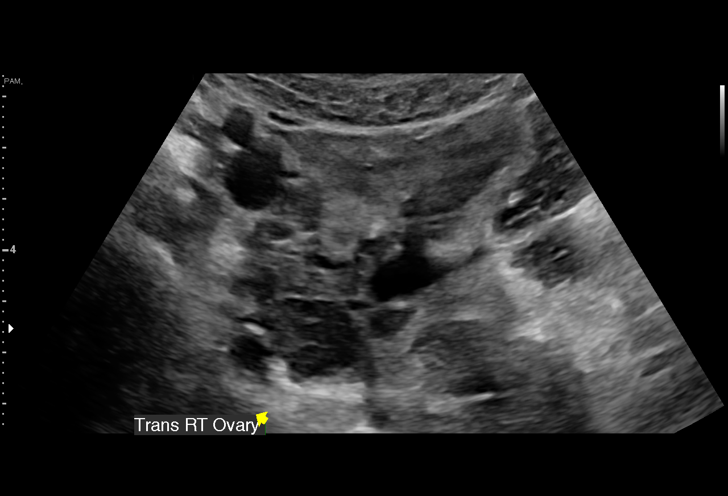
[im 18/47]
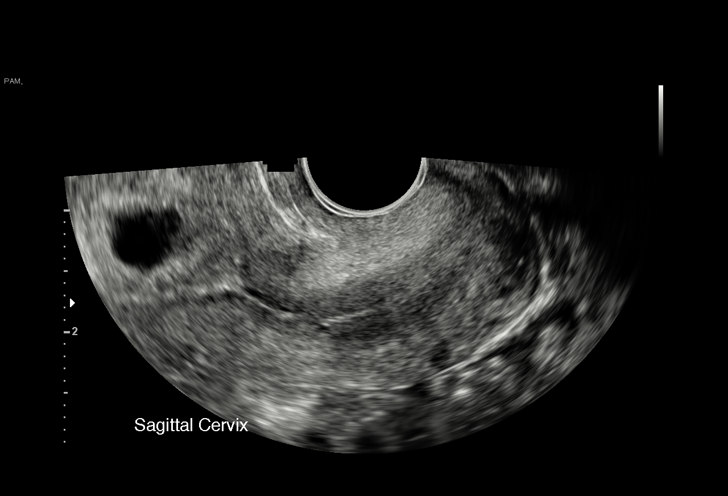
[im 21/47]
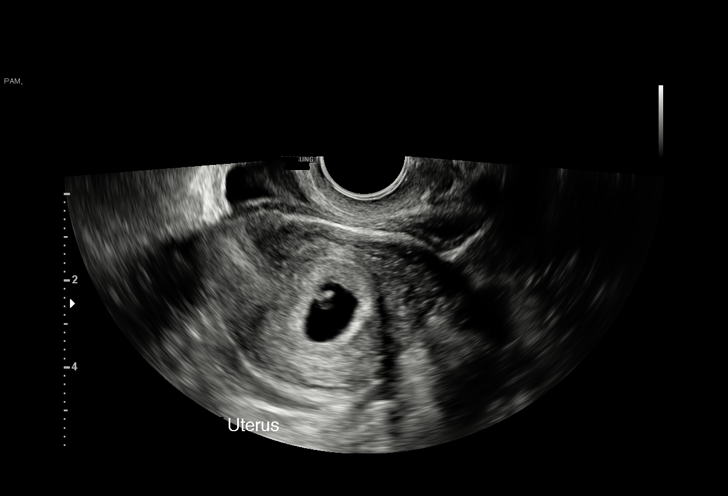
[im 24/47]
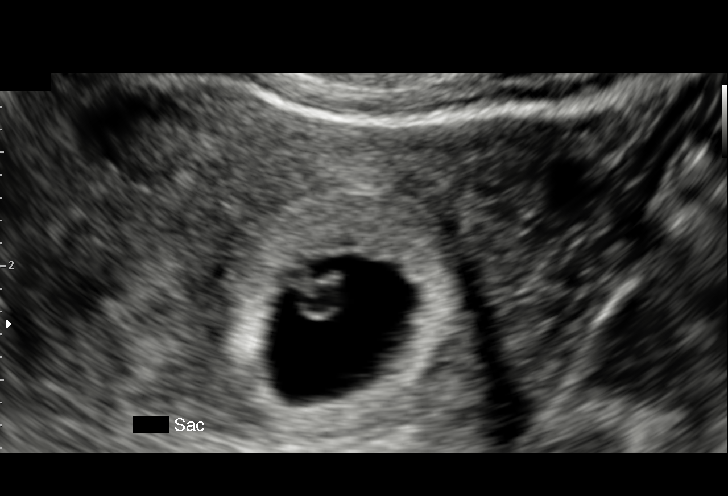
[im 26/47]
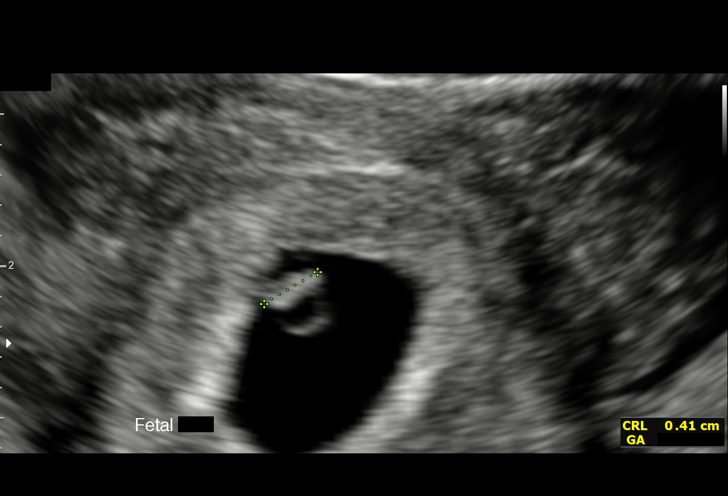
[im 29/47]
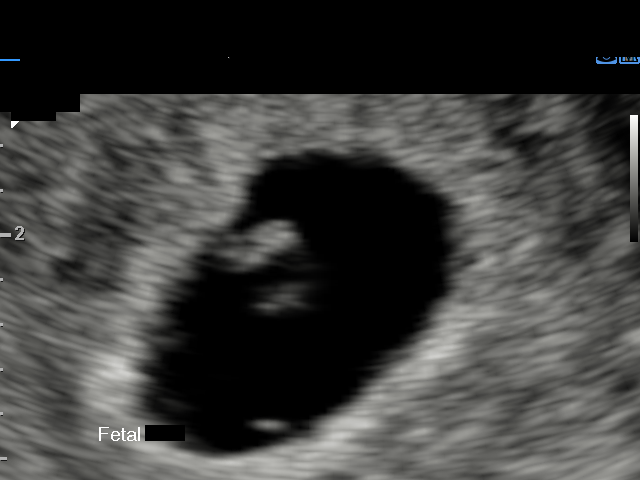
[im 33/47]
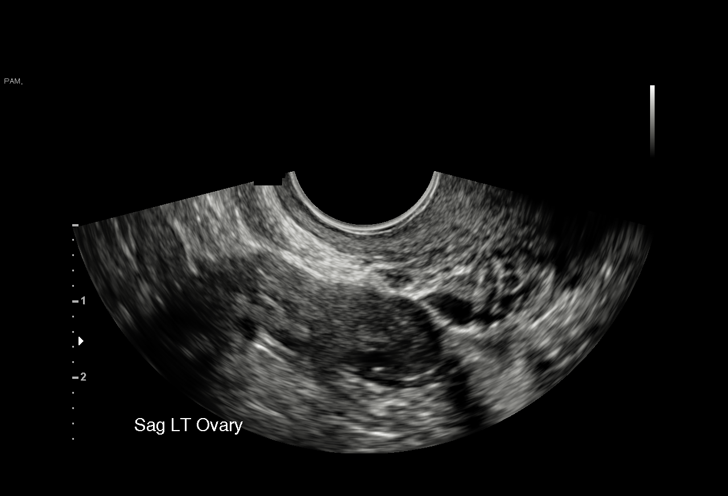
[im 36/47]
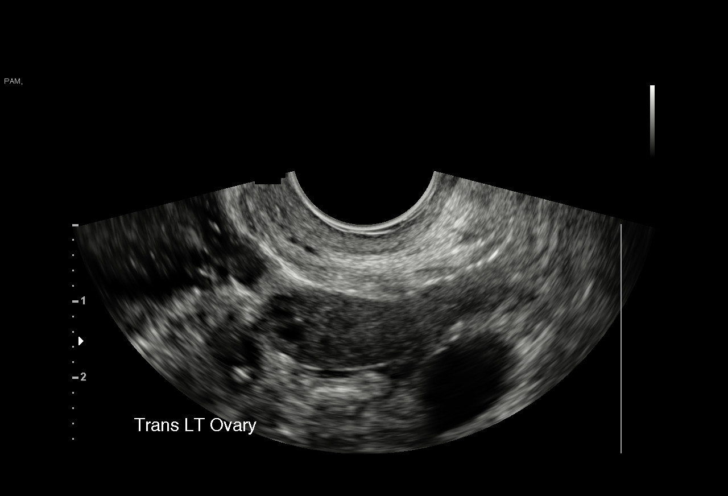
[im 40/47]
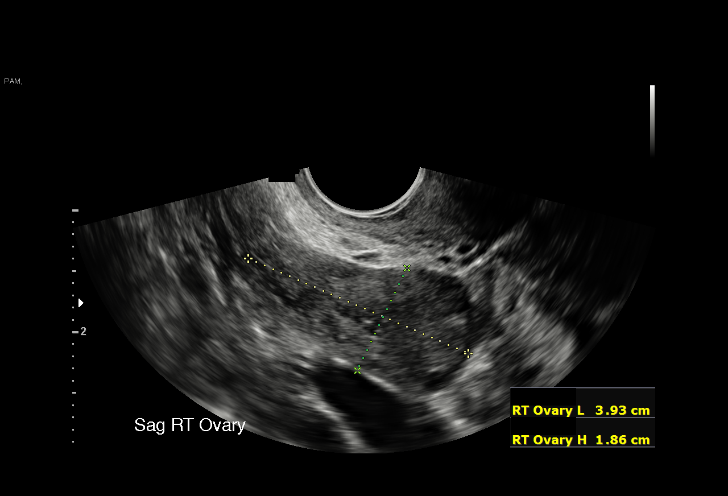
[im 43/47]
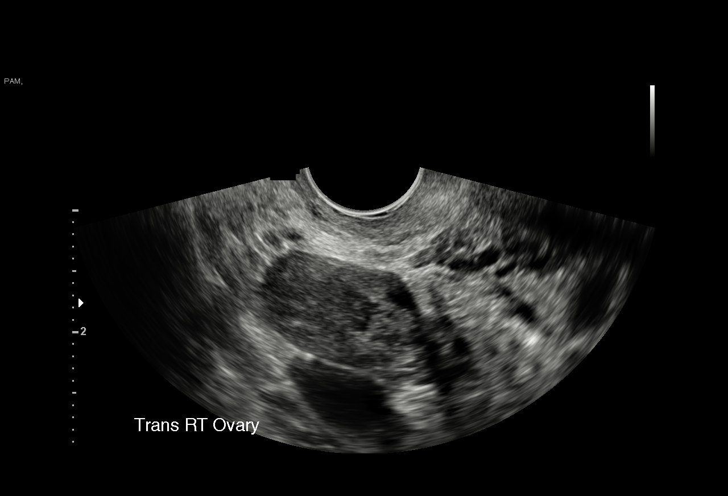
[im 47/47]
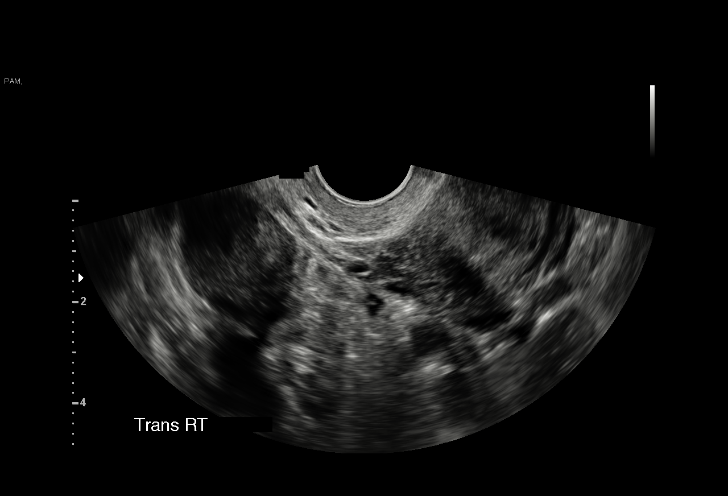

[15 of 28 positions shown; findings below may reference images not displayed]

FINDINGS: Intrauterine gestational sac: Single

Yolk sac:  Visualized.

Embryo:  Visualized.

Cardiac Activity: Visualized.

Heart Rate: 104 bpm

CRL:  4.2 mm   6 w   1 d                  US EDC: 07/23/2021

Subchorionic hemorrhage:  None visualized.

Maternal uterus/adnexae: Corpus luteal cyst noted on the right. The
right and left ovaries are otherwise unremarkable. No free fluid.
IMPRESSION: Single viable intrauterine pregnancy. By crown-rump length, the
estimated gestational age is 6 weeks 1 day and the estimated date of
confinement 07/23/2021.

## 2021-11-16 ENCOUNTER — Ambulatory Visit: Payer: 59

## 2021-11-30 ENCOUNTER — Other Ambulatory Visit: Payer: Self-pay

## 2021-11-30 ENCOUNTER — Encounter: Payer: Self-pay | Admitting: Obstetrics & Gynecology

## 2021-11-30 ENCOUNTER — Ambulatory Visit (INDEPENDENT_AMBULATORY_CARE_PROVIDER_SITE_OTHER): Payer: 59 | Admitting: Obstetrics & Gynecology

## 2021-11-30 ENCOUNTER — Other Ambulatory Visit (HOSPITAL_COMMUNITY)
Admission: RE | Admit: 2021-11-30 | Discharge: 2021-11-30 | Disposition: A | Discharge status: Home / Self Care | Payer: 59 | Source: Ambulatory Visit | Attending: Obstetrics & Gynecology | Admitting: Obstetrics & Gynecology

## 2021-11-30 VITALS — BP 100/60 | HR 98 | Resp 16 | Ht 61.75 in | Wt 106.0 lb

## 2021-11-30 DIAGNOSIS — Z01419 Encounter for gynecological examination (general) (routine) without abnormal findings: Secondary | ICD-10-CM | POA: Insufficient documentation

## 2021-11-30 DIAGNOSIS — Z3042 Encounter for surveillance of injectable contraceptive: Secondary | ICD-10-CM | POA: Diagnosis not present

## 2021-11-30 MED ORDER — MEDROXYPROGESTERONE ACETATE 150 MG/ML IM SUSP: 150.0000 mg | INTRAMUSCULAR | 4 refills | Status: AC

## 2021-11-30 MED ORDER — MEDROXYPROGESTERONE ACETATE 150 MG/ML IM SUSP
150.0000 mg | Freq: Once | INTRAMUSCULAR | Status: AC
  Administered 2021-11-30: 150 mg via INTRAMUSCULAR

## 2021-11-30 MED ORDER — MEDROXYPROGESTERONE ACETATE 150 MG/ML IM SUSP
150.0000 mg | Freq: Once | INTRAMUSCULAR | Status: DC
Start: 2021-11-30 — End: 2023-04-18

## 2021-11-30 NOTE — Progress Notes (Signed)
Mallory Myers 26-Nov-1996 [id number]   History:    26 y.o. . 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   RP:  Established patient presenting for annual gyn exam    HPI: Nexplanon removed 09/2020. Didn't like BCPs.  Started on DepoProvera with 1st injection 3 months ago, will receive her 2nd injection today.  No pelvic pain. Mild BTB x 1 week.  No pain with intercourse.  Urine and bowel movements normal.  Breasts normal.  Body mass index 19.54.  Fit with yoga and healthy nutrition.   Past medical history,surgical history, family history and social history were all reviewed and documented in the EPIC chart.  Gynecologic History No LMP recorded. Patient has had an injection.  Obstetric History OB History  Gravida Para Term Preterm AB Living  1 0 0 0 1 0  SAB IAB Ectopic Multiple Live Births  0 1 0 0      # Outcome Date GA Lbr Len/2nd Weight Sex Delivery Anes PTL Lv  1 IAB              ROS: A ROS was performed and pertinent positives and negatives are included in the history.  GENERAL: No fevers or chills. HEENT: No change in vision, no earache, sore throat or sinus congestion. NECK: No pain or stiffness. CARDIOVASCULAR: No chest pain or pressure. No palpitations. PULMONARY: No shortness of breath, cough or wheeze. GASTROINTESTINAL: No abdominal pain, nausea, vomiting or diarrhea, melena or bright red blood per rectum. GENITOURINARY: No urinary frequency, urgency, hesitancy or dysuria. MUSCULOSKELETAL: No joint or muscle pain, no back pain, no recent trauma. DERMATOLOGIC: No rash, no itching, no lesions. ENDOCRINE: No polyuria, polydipsia, no heat or cold intolerance. No recent change in weight. HEMATOLOGICAL: No anemia or easy bruising or bleeding. NEUROLOGIC: No headache, seizures, numbness, tingling or weakness. PSYCHIATRIC: No depression, no loss of interest in normal activity or change in sleep pattern.     Exam:   BP 100/60    Pulse 98    Resp 16    Ht 5'  1.75" (1.568 m)    Wt 106 lb (48.1 kg)    BMI 19.54 kg/m   Body mass index is 19.54 kg/m.  General appearance : Well developed well nourished female. No acute distress HEENT: Eyes: no retinal hemorrhage or exudates,  Neck supple, trachea midline, no carotid bruits, no thyroidmegaly Lungs: Clear to auscultation, no rhonchi or wheezes, or rib retractions  Heart: Regular rate and rhythm, no murmurs or gallops Breast:Examined in sitting and supine position were symmetrical in appearance, no palpable masses or tenderness,  no skin retraction, no nipple inversion, no nipple discharge, no skin discoloration, no axillary or supraclavicular lymphadenopathy Abdomen: no palpable masses or tenderness, no rebound or guarding Extremities: no edema or skin discoloration or tenderness  Pelvic: Vulva: Normal             Vagina: No gross lesions or discharge  Cervix: No gross lesions or discharge.  Pap reflex done.  Uterus  AV, normal size, shape and consistency, non-tender and mobile  Adnexa  Without masses or tenderness  Anus: Normal   Assessment/Plan:  26 y.o. female for annual exam   1. Encounter for routine gynecological examination with Papanicolaou smear of cervix Nexplanon removed 09/2020. Didn't like BCPs.  Started on DepoProvera with 1st injection 3 months ago, will receive her 2nd injection today.  No pelvic pain.  Mild BTB x 1 week.  No pain with  intercourse.  Urine and bowel movements normal.  Breasts normal.  Body mass index 19.54.  Fit with yoga and healthy nutrition. - Cytology - PAP( [facility])  2. Encounter for surveillance of injectable contraceptive Well on DepoProvera.  No CI to continue.  Injection given today and prescription sent to pharmacy for the rest of the year.  Will make sure she has good nutritional intake of Ca++ and start a Vit D supplement.  Continue Yoga. - medroxyPROGESTERone (DEPO-PROVERA) injection 150 mg  Other orders - medroxyPROGESTERone (DEPO-PROVERA) 150  MG/ML injection; Inject 1 mL (150 mg total) into the muscle every 3 (three) months.   Genia Del MD, 9:08 AM 11/30/2021

## 2021-12-01 LAB — CYTOLOGY - PAP: Diagnosis: NEGATIVE

## 2022-02-28 ENCOUNTER — Ambulatory Visit (INDEPENDENT_AMBULATORY_CARE_PROVIDER_SITE_OTHER): Payer: 59 | Admitting: *Deleted

## 2022-02-28 DIAGNOSIS — Z3042 Encounter for surveillance of injectable contraceptive: Secondary | ICD-10-CM

## 2022-02-28 MED ORDER — MEDROXYPROGESTERONE ACETATE 150 MG/ML IM SUSP
150.0000 mg | Freq: Once | INTRAMUSCULAR | Status: AC
  Administered 2022-02-28: 150 mg via INTRAMUSCULAR

## 2022-05-16 ENCOUNTER — Ambulatory Visit (INDEPENDENT_AMBULATORY_CARE_PROVIDER_SITE_OTHER): Payer: 59 | Admitting: Gynecology

## 2022-05-16 DIAGNOSIS — Z3042 Encounter for surveillance of injectable contraceptive: Secondary | ICD-10-CM

## 2022-05-16 MED ORDER — MEDROXYPROGESTERONE ACETATE 150 MG/ML IM SUSP
150.0000 mg | Freq: Once | INTRAMUSCULAR | Status: AC
  Administered 2022-05-16: 150 mg via INTRAMUSCULAR

## 2022-08-08 ENCOUNTER — Ambulatory Visit (INDEPENDENT_AMBULATORY_CARE_PROVIDER_SITE_OTHER): Payer: 59

## 2022-08-08 DIAGNOSIS — Z3042 Encounter for surveillance of injectable contraceptive: Secondary | ICD-10-CM | POA: Diagnosis not present

## 2022-08-08 MED ORDER — MEDROXYPROGESTERONE ACETATE 150 MG/ML IM SUSP
150.0000 mg | Freq: Once | INTRAMUSCULAR | Status: AC
  Administered 2022-08-08: 150 mg via INTRAMUSCULAR

## 2022-10-24 ENCOUNTER — Ambulatory Visit (INDEPENDENT_AMBULATORY_CARE_PROVIDER_SITE_OTHER): Payer: Self-pay | Admitting: *Deleted

## 2022-10-24 DIAGNOSIS — Z3042 Encounter for surveillance of injectable contraceptive: Secondary | ICD-10-CM

## 2022-10-24 MED ORDER — MEDROXYPROGESTERONE ACETATE 150 MG/ML IM SUSP
150.0000 mg | Freq: Once | INTRAMUSCULAR | Status: AC
  Administered 2022-10-24: 150 mg via INTRAMUSCULAR

## 2022-12-06 ENCOUNTER — Ambulatory Visit: Payer: 59 | Admitting: Obstetrics & Gynecology

## 2023-01-09 ENCOUNTER — Ambulatory Visit (INDEPENDENT_AMBULATORY_CARE_PROVIDER_SITE_OTHER): Payer: Medicaid Other | Admitting: *Deleted

## 2023-01-09 DIAGNOSIS — Z3042 Encounter for surveillance of injectable contraceptive: Secondary | ICD-10-CM

## 2023-01-09 MED ORDER — MEDROXYPROGESTERONE ACETATE 150 MG/ML IM SUSP
150.0000 mg | Freq: Once | INTRAMUSCULAR | Status: AC
  Administered 2023-01-09: 150 mg via INTRAMUSCULAR

## 2023-02-06 ENCOUNTER — Ambulatory Visit: Payer: 59 | Admitting: Obstetrics & Gynecology

## 2023-03-28 ENCOUNTER — Ambulatory Visit: Payer: Medicaid Other

## 2023-04-03 ENCOUNTER — Ambulatory Visit (INDEPENDENT_AMBULATORY_CARE_PROVIDER_SITE_OTHER): Payer: Medicaid Other

## 2023-04-03 DIAGNOSIS — Z3042 Encounter for surveillance of injectable contraceptive: Secondary | ICD-10-CM | POA: Diagnosis not present

## 2023-04-03 MED ORDER — MEDROXYPROGESTERONE ACETATE 150 MG/ML IM SUSY
150.0000 mg | PREFILLED_SYRINGE | Freq: Once | INTRAMUSCULAR | Status: AC
  Administered 2023-04-03: 150 mg via INTRAMUSCULAR

## 2023-04-18 ENCOUNTER — Other Ambulatory Visit (HOSPITAL_COMMUNITY)
Admission: RE | Admit: 2023-04-18 | Discharge: 2023-04-18 | Disposition: A | Discharge status: Home / Self Care | Payer: Medicaid Other | Source: Ambulatory Visit | Attending: Obstetrics & Gynecology | Admitting: Obstetrics & Gynecology

## 2023-04-18 ENCOUNTER — Encounter: Payer: Self-pay | Admitting: Obstetrics & Gynecology

## 2023-04-18 ENCOUNTER — Ambulatory Visit: Payer: Medicaid Other | Admitting: Obstetrics & Gynecology

## 2023-04-18 VITALS — BP 110/62 | HR 88 | Ht 61.25 in | Wt 122.0 lb

## 2023-04-18 DIAGNOSIS — Z01419 Encounter for gynecological examination (general) (routine) without abnormal findings: Secondary | ICD-10-CM | POA: Insufficient documentation

## 2023-04-18 DIAGNOSIS — Z3042 Encounter for surveillance of injectable contraceptive: Secondary | ICD-10-CM

## 2023-04-18 DIAGNOSIS — Z3009 Encounter for other general counseling and advice on contraception: Secondary | ICD-10-CM

## 2023-04-18 NOTE — Progress Notes (Signed)
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   RP:  Established patient presenting for annual gyn exam    HPI: Nexplanon removed 09/2020. Didn't like BCPs.  Started on Depo-Provera 1 year ago, last injection 2 weeks ago.  Would like to switch to a non-hormonal contraceptive with ParaGard IUD.  No pelvic pain.  No pain with intercourse. Pap 11/2021 Neg.  Pap reflex today. Urine and bowel movements normal.  Breasts normal. Body mass index 22.86.  Fit with yoga and healthy nutrition.     Past medical history,surgical history, family history and social history were all reviewed and documented in the EPIC chart.  Gynecologic History No LMP recorded. Patient has had an injection.  Obstetric History OB History  Gravida Para Term Preterm AB Living  1 0 0 0 1 0  SAB IAB Ectopic Multiple Live Births  0 1 0 0      # Outcome Date GA Lbr Len/2nd Weight Sex Delivery Anes PTL Lv  1 IAB              ROS: A ROS was performed and pertinent positives and negatives are included in the history. GENERAL: No fevers or chills. HEENT: No change in vision, no earache, sore throat or sinus congestion. NECK: No pain or stiffness. CARDIOVASCULAR: No chest pain or pressure. No palpitations. PULMONARY: No shortness of breath, cough or wheeze. GASTROINTESTINAL: No abdominal pain, nausea, vomiting or diarrhea, melena or bright red blood per rectum. GENITOURINARY: No urinary frequency, urgency, hesitancy or dysuria. MUSCULOSKELETAL: No joint or muscle pain, no back pain, no recent trauma. DERMATOLOGIC: No rash, no itching, no lesions. ENDOCRINE: No polyuria, polydipsia, no heat or cold intolerance. No recent change in weight. HEMATOLOGICAL: No anemia or easy bruising or bleeding. NEUROLOGIC: No headache, seizures, numbness, tingling or weakness. PSYCHIATRIC: No depression, no loss of interest in normal activity or change in sleep pattern.      Exam:   BP 110/62   Pulse 88   Ht 5' 1.25" (1.556 m)   Wt 122 lb (55.3 kg)   SpO2 99%   BMI 22.86 kg/m   Body mass index is 22.86 kg/m.  General appearance : Well developed well nourished female. No acute distress HEENT: Eyes: no retinal hemorrhage or exudates,  Neck supple, trachea midline, no carotid bruits, no thyroidmegaly Lungs: Clear to auscultation, no rhonchi or wheezes, or rib retractions  Heart: Regular rate and rhythm, no murmurs or gallops Breast:Examined in sitting and supine position were symmetrical in appearance, no palpable masses or tenderness,  no skin retraction, no nipple inversion, no nipple discharge, no skin discoloration, no axillary or supraclavicular lymphadenopathy Abdomen: no palpable masses or tenderness, no rebound or guarding Extremities: no edema or skin discoloration or tenderness  Pelvic: Vulva: Normal             Vagina: No gross lesions or discharge  Cervix: No gross lesions or discharge.  Pap reflex done.  Uterus  AV, normal size, shape and consistency, non-tender and mobile  Adnexa  Without masses or tenderness  Anus: Normal   Assessment/Plan:  27 y.o. female for annual exam   1. Encounter for routine gynecological examination with Papanicolaou smear of cervix Nexplanon removed 09/2020. Didn't like BCPs.  Started on Depo-Provera 1 year ago, last injection 2 weeks ago.  Would like to switch to a non-hormonal contraceptive with ParaGard IUD.  No pelvic pain.  No pain with intercourse. Pap 11/2021 Neg.  Pap reflex today. Urine and bowel movements normal.  Breasts normal. Body mass index 22.86.  Fit with yoga and healthy nutrition. - Cytology - PAP( [facility])  2. Encounter for surveillance of injectable contraceptive Last injection 2 weeks ago.  Would like to switch to a non-hormonal contraceptive with the ParaGard IUD.  3. Encounter for other general counseling or advice on contraception Counseling on ParaGard IUD. Insertion  procedure/Risks/Benefits reviewed.  No CI.  Will f/u for IUD insertion.  Ibuprofen pre-insertion recommended. - IUD Insertion; Future   Genia Del MD, 3:37 PM

## 2023-04-25 LAB — CYTOLOGY - PAP: Diagnosis: NEGATIVE

## 2023-05-23 ENCOUNTER — Ambulatory Visit: Payer: Medicaid Other | Admitting: Obstetrics & Gynecology

## 2023-06-19 ENCOUNTER — Ambulatory Visit: Payer: Medicaid Other

## 2023-06-27 ENCOUNTER — Ambulatory Visit (INDEPENDENT_AMBULATORY_CARE_PROVIDER_SITE_OTHER): Payer: Medicaid Other

## 2023-06-27 DIAGNOSIS — Z3042 Encounter for surveillance of injectable contraceptive: Secondary | ICD-10-CM

## 2023-06-27 MED ORDER — MEDROXYPROGESTERONE ACETATE 150 MG/ML IM SUSY
150.0000 mg | PREFILLED_SYRINGE | Freq: Once | INTRAMUSCULAR | Status: AC
Start: 2023-06-27 — End: 2023-06-27
  Administered 2023-06-27: 150 mg via INTRAMUSCULAR

## 2023-06-27 NOTE — Progress Notes (Signed)
Patient ID: Mallory Myers, female   DOB: [birth year], 27 y.o.   MRN: [id number] Depo Provera 150mg  given IM RUOQ.  Patient tolerated injection well.  Next injection is due 09/12/23-09/26/23.

## 2023-09-12 ENCOUNTER — Ambulatory Visit: Payer: Medicaid Other

## 2024-09-24 ENCOUNTER — Ambulatory Visit (INDEPENDENT_AMBULATORY_CARE_PROVIDER_SITE_OTHER): Admitting: Radiology

## 2024-09-24 ENCOUNTER — Encounter: Payer: Self-pay | Admitting: Radiology

## 2024-09-24 VITALS — BP 98/64 | HR 65 | Ht 63.0 in | Wt 124.0 lb

## 2024-09-24 DIAGNOSIS — L290 Pruritus ani: Secondary | ICD-10-CM

## 2024-09-24 DIAGNOSIS — Z1331 Encounter for screening for depression: Secondary | ICD-10-CM | POA: Diagnosis not present

## 2024-09-24 DIAGNOSIS — Z01419 Encounter for gynecological examination (general) (routine) without abnormal findings: Secondary | ICD-10-CM

## 2024-09-24 MED ORDER — NYSTATIN-TRIAMCINOLONE 100000-0.1 UNIT/GM-% EX OINT
1.0000 | TOPICAL_OINTMENT | Freq: Two times a day (BID) | CUTANEOUS | 0 refills | Status: AC
Start: 2024-09-24 — End: ?

## 2024-09-24 NOTE — Patient Instructions (Signed)

## 2024-09-24 NOTE — Progress Notes (Signed)
 Mallory Myers Oct 06, 1996 [id number]   History:  28 y.o. G0 presents for annual exam.c/o anal itching x 8 months. Has tried prepH, hydrocortisone, sitz baths with no relief. Does use wet wipes after a BM. Itching can be worse at night. No vaginal symptoms. No new partners, declines STI screening.   Gynecologic History Patient's last menstrual period was 09/05/2024 (exact date). Period Cycle (Days): 28 Period Duration (Days): 5 Period Pattern: Regular Menstrual Flow: Moderate Menstrual Control: Tampon Dysmenorrhea: (!) Mild Dysmenorrhea Symptoms: Cramping Contraception/Family planning: condoms Sexually active: yes Last Pap: 5/24. Results were: normal   Obstetric History OB History  Gravida Para Term Preterm AB Living  1 0 0 0 1 0  SAB IAB Ectopic Multiple Live Births  0 1 0 0     # Outcome Date GA Lbr Len/2nd Weight Sex Type Anes PTL Lv  1 IAB                09/24/2024    8:08 AM  Depression screen PHQ 2/9  Decreased Interest 0  Down, Depressed, Hopeless 0  PHQ - 2 Score 0     The following portions of the patient's history were reviewed and updated as appropriate: allergies, current medications, past family history, past medical history, past social history, past surgical history, and problem list.  Review of Systems  Constitutional: Negative.   Cardiovascular: Negative.   Gastrointestinal:  Negative for abdominal pain, blood in stool, constipation, nausea and vomiting.  Genitourinary: Negative.  Negative for dysuria, flank pain, frequency, hematuria and urgency.  Endo/Heme/Allergies:  Does not bruise/bleed easily.  Psychiatric/Behavioral: Negative.  Negative for depression, memory loss and substance abuse. The patient is not nervous/anxious and does not have insomnia.     Past medical history, past surgical history, family history and social history were all reviewed and documented in the EPIC chart.  Exam:  Vitals:   09/24/24 0807  BP: 98/64  Pulse: 65   SpO2: 99%  Weight: 124 lb (56.2 kg)  Height: 5' 3 (1.6 m)   Body mass index is 21.97 kg/m.  Physical Exam Vitals and nursing note reviewed. Exam conducted with a chaperone present.  Constitutional:      Appearance: Normal appearance. She is normal weight.  HENT:     Head: Normocephalic and atraumatic.  Neck:     Thyroid: No thyroid mass, thyromegaly or thyroid tenderness.  Cardiovascular:     Rate and Rhythm: Regular rhythm.     Heart sounds: Normal heart sounds.  Pulmonary:     Effort: Pulmonary effort is normal.     Breath sounds: Normal breath sounds.  Chest:  Breasts:    Breasts are symmetrical.     Right: Normal. No inverted nipple, mass, nipple discharge, skin change or tenderness.     Left: Normal. No inverted nipple, mass, nipple discharge, skin change or tenderness.  Abdominal:     General: Abdomen is flat. Bowel sounds are normal.     Palpations: Abdomen is soft.  Genitourinary:    General: Normal vulva.     Vagina: Normal. No vaginal discharge, bleeding or lesions.     Cervix: Normal. No discharge or lesion.     Uterus: Normal. Not enlarged and not tender.      Adnexa: Right adnexa normal and left adnexa normal.       Right: No mass, tenderness or fullness.         Left: No mass, tenderness or fullness.     Lymphadenopathy:  Upper Body:     Right upper body: No axillary adenopathy.     Left upper body: No axillary adenopathy.  Skin:    General: Skin is warm and dry.  Neurological:     Mental Status: She is alert and oriented to person, place, and time.  Psychiatric:        Mood and Affect: Mood normal.        Thought Content: Thought content normal.        Judgment: Judgment normal.      Darice Hoit, CMA present for exam  Assessment/Plan:   1. Well woman exam with routine gynecological exam (Primary) Pap 2027  2. Anal itch Use twice daily x 2 weeks Switch to rinsing with water or WATER wipes, not wet wipes Dove white bar soap to clean If  no improvement after 2 weeks will refer to derm - nystatin-triamcinolone ointment (MYCOLOG); Apply 1 Application topically 2 (two) times daily.  Dispense: 30 g; Refill: 0  3. Depression screen negative     Return in about 1 year (around 09/24/2025) for Annual.  Mallory Myers WHNP-BC 8:26 AM 09/24/2024

## 2025-09-30 ENCOUNTER — Ambulatory Visit: Admitting: Radiology
